# Patient Record
Sex: Female | Born: 1990 | Hispanic: No | Marital: Single | Smoking: Current every day smoker
Health system: Southern US, Community
[De-identification: ages and names within clinical notes are randomized; demographics above are authoritative.]

## PROBLEM LIST (undated history)

## (undated) ENCOUNTER — Inpatient Hospital Stay (HOSPITAL_COMMUNITY): Payer: Self-pay

## (undated) ENCOUNTER — Inpatient Hospital Stay: Payer: Self-pay

## (undated) DIAGNOSIS — D649 Anemia, unspecified: Secondary | ICD-10-CM

## (undated) DIAGNOSIS — O139 Gestational [pregnancy-induced] hypertension without significant proteinuria, unspecified trimester: Secondary | ICD-10-CM

## (undated) HISTORY — PX: WISDOM TOOTH EXTRACTION: SHX21

## (undated) HISTORY — DX: Gestational (pregnancy-induced) hypertension without significant proteinuria, unspecified trimester: O13.9

---

## 2009-03-05 DIAGNOSIS — O139 Gestational [pregnancy-induced] hypertension without significant proteinuria, unspecified trimester: Secondary | ICD-10-CM

## 2009-03-05 HISTORY — DX: Gestational (pregnancy-induced) hypertension without significant proteinuria, unspecified trimester: O13.9

## 2009-03-22 ENCOUNTER — Inpatient Hospital Stay (HOSPITAL_COMMUNITY): Admission: AD | Admit: 2009-03-22 | Discharge: 2009-03-22 | Payer: Self-pay | Admitting: Obstetrics and Gynecology

## 2009-06-03 ENCOUNTER — Ambulatory Visit: Payer: Self-pay | Admitting: Advanced Practice Midwife

## 2009-07-04 ENCOUNTER — Ambulatory Visit: Payer: Self-pay | Admitting: Obstetrics and Gynecology

## 2009-07-04 ENCOUNTER — Inpatient Hospital Stay (HOSPITAL_COMMUNITY): Admission: AD | Admit: 2009-07-04 | Discharge: 2009-07-04 | Payer: Self-pay | Admitting: Obstetrics & Gynecology

## 2009-10-24 ENCOUNTER — Observation Stay: Payer: Self-pay | Admitting: Unknown Physician Specialty

## 2009-10-29 ENCOUNTER — Observation Stay: Payer: Self-pay | Admitting: Obstetrics and Gynecology

## 2009-10-30 ENCOUNTER — Inpatient Hospital Stay: Payer: Self-pay | Admitting: Obstetrics and Gynecology

## 2010-05-20 ENCOUNTER — Emergency Department (HOSPITAL_COMMUNITY)
Admission: EM | Admit: 2010-05-20 | Discharge: 2010-05-20 | Disposition: A | Discharge status: Home / Self Care | Payer: Self-pay | Attending: Emergency Medicine | Admitting: Emergency Medicine

## 2010-05-20 DIAGNOSIS — L02219 Cutaneous abscess of trunk, unspecified: Secondary | ICD-10-CM | POA: Insufficient documentation

## 2010-05-22 LAB — WET PREP, GENITAL
Trich, Wet Prep: NONE SEEN
Yeast Wet Prep HPF POC: NONE SEEN

## 2010-05-22 LAB — CBC
HCT: 37.4 % (ref 36.0–46.0)
Hemoglobin: 12.4 g/dL (ref 12.0–15.0)
MCHC: 33 g/dL (ref 30.0–36.0)
MCV: 87.5 fL (ref 78.0–100.0)
RBC: 4.27 MIL/uL (ref 3.87–5.11)
RDW: 12.6 % (ref 11.5–15.5)

## 2010-05-22 LAB — URINALYSIS, ROUTINE W REFLEX MICROSCOPIC
Bilirubin Urine: NEGATIVE
Glucose, UA: NEGATIVE mg/dL
Hgb urine dipstick: NEGATIVE

## 2010-05-22 LAB — GC/CHLAMYDIA PROBE AMP, GENITAL: Chlamydia, DNA Probe: POSITIVE — AB

## 2010-05-22 LAB — HCG, QUANTITATIVE, PREGNANCY: hCG, Beta Chain, Quant, S: 62711 m[IU]/mL — ABNORMAL HIGH (ref <5)

## 2010-05-22 LAB — URINE MICROSCOPIC-ADD ON

## 2010-05-23 LAB — URINALYSIS, ROUTINE W REFLEX MICROSCOPIC
Bilirubin Urine: NEGATIVE
Glucose, UA: NEGATIVE mg/dL
Hgb urine dipstick: NEGATIVE
Ketones, ur: NEGATIVE mg/dL
Nitrite: NEGATIVE
Protein, ur: NEGATIVE mg/dL
Specific Gravity, Urine: 1.025 (ref 1.005–1.030)

## 2013-09-14 ENCOUNTER — Emergency Department: Payer: Self-pay | Admitting: Emergency Medicine

## 2013-09-17 LAB — BETA STREP CULTURE(ARMC)

## 2014-01-08 ENCOUNTER — Inpatient Hospital Stay (HOSPITAL_COMMUNITY): Payer: Medicaid Other

## 2014-01-08 ENCOUNTER — Encounter (HOSPITAL_COMMUNITY): Payer: Self-pay | Admitting: *Deleted

## 2014-01-08 ENCOUNTER — Inpatient Hospital Stay (HOSPITAL_COMMUNITY)
Admission: AD | Admit: 2014-01-08 | Discharge: 2014-01-08 | Disposition: A | Discharge status: Home / Self Care | Payer: Medicaid Other | Source: Ambulatory Visit | Attending: Obstetrics & Gynecology | Admitting: Obstetrics & Gynecology

## 2014-01-08 DIAGNOSIS — O26899 Other specified pregnancy related conditions, unspecified trimester: Secondary | ICD-10-CM

## 2014-01-08 DIAGNOSIS — O3411 Maternal care for benign tumor of corpus uteri, first trimester: Secondary | ICD-10-CM | POA: Insufficient documentation

## 2014-01-08 DIAGNOSIS — R109 Unspecified abdominal pain: Secondary | ICD-10-CM | POA: Insufficient documentation

## 2014-01-08 DIAGNOSIS — O9989 Other specified diseases and conditions complicating pregnancy, childbirth and the puerperium: Secondary | ICD-10-CM

## 2014-01-08 DIAGNOSIS — O26891 Other specified pregnancy related conditions, first trimester: Secondary | ICD-10-CM | POA: Diagnosis not present

## 2014-01-08 DIAGNOSIS — F17200 Nicotine dependence, unspecified, uncomplicated: Secondary | ICD-10-CM | POA: Diagnosis not present

## 2014-01-08 DIAGNOSIS — Z3A01 Less than 8 weeks gestation of pregnancy: Secondary | ICD-10-CM | POA: Insufficient documentation

## 2014-01-08 LAB — CBC WITH DIFFERENTIAL/PLATELET
BASOS ABS: 0 10*3/uL (ref 0.0–0.1)
Basophils Relative: 0 % (ref 0–1)
Eosinophils Absolute: 0.1 10*3/uL (ref 0.0–0.7)
Eosinophils Relative: 3 % (ref 0–5)
HEMATOCRIT: 37.6 % (ref 36.0–46.0)
HEMOGLOBIN: 12.5 g/dL (ref 12.0–15.0)
LYMPHS ABS: 1.9 10*3/uL (ref 0.7–4.0)
LYMPHS PCT: 34 % (ref 12–46)
MCH: 28.7 pg (ref 26.0–34.0)
MCHC: 33.2 g/dL (ref 30.0–36.0)
MCV: 86.2 fL (ref 78.0–100.0)
MONO ABS: 0.4 10*3/uL (ref 0.1–1.0)
Monocytes Relative: 7 % (ref 3–12)
NEUTROS ABS: 3.2 10*3/uL (ref 1.7–7.7)
Neutrophils Relative %: 56 % (ref 43–77)
Platelets: 204 10*3/uL (ref 150–400)
RBC: 4.36 MIL/uL (ref 3.87–5.11)
RDW: 12.3 % (ref 11.5–15.5)
WBC: 5.7 10*3/uL (ref 4.0–10.5)

## 2014-01-08 LAB — WET PREP, GENITAL
Clue Cells Wet Prep HPF POC: NONE SEEN
Trich, Wet Prep: NONE SEEN
YEAST WET PREP: NONE SEEN

## 2014-01-08 LAB — URINALYSIS, ROUTINE W REFLEX MICROSCOPIC
Bilirubin Urine: NEGATIVE
Glucose, UA: NEGATIVE mg/dL
Hgb urine dipstick: NEGATIVE
KETONES UR: NEGATIVE mg/dL
NITRITE: NEGATIVE
PROTEIN: NEGATIVE mg/dL
Specific Gravity, Urine: 1.025 (ref 1.005–1.030)
UROBILINOGEN UA: 0.2 mg/dL (ref 0.0–1.0)
pH: 6 (ref 5.0–8.0)

## 2014-01-08 LAB — URINE MICROSCOPIC-ADD ON

## 2014-01-08 LAB — HIV ANTIBODY (ROUTINE TESTING W REFLEX): HIV 1&2 Ab, 4th Generation: NONREACTIVE

## 2014-01-08 LAB — HCG, QUANTITATIVE, PREGNANCY: hCG, Beta Chain, Quant, S: 271 m[IU]/mL — ABNORMAL HIGH (ref <5)

## 2014-01-08 LAB — POCT PREGNANCY, URINE: Preg Test, Ur: POSITIVE — AB

## 2014-01-08 NOTE — MAU Provider Note (Signed)
History     CSN: [id number]  Arrival date and time: 01/08/14 4098   First Provider Initiated Contact with Patient 01/08/14 1002      Chief Complaint  Patient presents with  . Abdominal Pain  . Possible Pregnancy   HPI  Ms. SHAWNAE LEIVA is a 23 y.o. G2P1001 at [redacted]w[redacted]d who presents to MAU today with complaint of lower abdominal cramping since yesterday. She states at worst pain is rated at 6/10. She took Ibuprofen yesterday with little relief. She denies pain at this time. She denies vaginal bleeding, discharge, UTI symptoms, fever or N/V/D. She endorses constipation, but states last BM was yesterday. LMP 12/10/13.    OB History    Gravida Para Term Preterm AB TAB SAB Ectopic Multiple Living   2 1 1       1       Past Medical History  Diagnosis Date  . Hypertension     gestational    Past Surgical History  Procedure Laterality Date  . Wisdom tooth extraction      Family History  Problem Relation Age of Onset  . Hypertension Mother   . Hypertension Maternal Grandmother   . Cancer Maternal Grandfather     History  Substance Use Topics  . Smoking status: Current Every Day Smoker -- 0.10 packs/day  . Smokeless tobacco: Never Used  . Alcohol Use: Yes     Comment: every weekend     Allergies: No Known Allergies  No prescriptions prior to admission    Review of Systems  Constitutional: Negative for fever and malaise/fatigue.  Gastrointestinal: Positive for constipation. Negative for nausea, vomiting, abdominal pain and diarrhea.  Genitourinary: Negative for dysuria, urgency and frequency.       Neg - vaginal bleeding, discharge   Physical Exam   Blood pressure 117/68, pulse 79, temperature 98.6 F (37 C), temperature source Oral, resp. rate 16, height 5' 5.5" (1.664 m), weight 192 lb (87.091 kg), last menstrual period 12/10/2013, SpO2 99 %.  Physical Exam  Constitutional: She is oriented to person, place, and time. She appears well-developed and  well-nourished. No distress.  HENT:  Head: Normocephalic.  Cardiovascular: Normal rate.   Respiratory: Effort normal.  GI: Soft. Bowel sounds are normal. She exhibits no distension and no mass. There is no tenderness. There is no rebound and no guarding.  Genitourinary: Uterus is not enlarged and not tender. Cervix exhibits no motion tenderness, no discharge and no friability. Right adnexum displays no mass and no tenderness. Left adnexum displays no mass and no tenderness. No bleeding in the vagina. Vaginal discharge (small amount of thin, white discharge noted) found.  Neurological: She is alert and oriented to person, place, and time.  Skin: Skin is warm and dry. No erythema.  Psychiatric: She has a normal mood and affect.    Results for orders placed or performed during the hospital encounter of 01/08/14 (from the past 24 hour(s))  Urinalysis, Routine w reflex microscopic     Status: Abnormal   Collection Time: 01/08/14  9:30 AM  Result Value Ref Range   Color, Urine YELLOW YELLOW   APPearance CLOUDY (A) CLEAR   Specific Gravity, Urine 1.025 1.005 - 1.030   pH 6.0 5.0 - 8.0   Glucose, UA NEGATIVE NEGATIVE mg/dL   Hgb urine dipstick NEGATIVE NEGATIVE   Bilirubin Urine NEGATIVE NEGATIVE   Ketones, ur NEGATIVE NEGATIVE mg/dL   Protein, ur NEGATIVE NEGATIVE mg/dL   Urobilinogen, UA 0.2 0.0 - 1.0 mg/dL  Nitrite NEGATIVE NEGATIVE   Leukocytes, UA SMALL (A) NEGATIVE  Urine microscopic-add on     Status: Abnormal   Collection Time: 01/08/14  9:30 AM  Result Value Ref Range   Squamous Epithelial / LPF MANY (A) RARE   WBC, UA 0-2 <3 WBC/hpf   Bacteria, UA MANY (A) RARE   Urine-Other MUCOUS PRESENT   Pregnancy, urine POC     Status: Abnormal   Collection Time: 01/08/14  9:45 AM  Result Value Ref Range   Preg Test, Ur POSITIVE (A) NEGATIVE  hCG, quantitative, pregnancy     Status: Abnormal   Collection Time: 01/08/14 10:09 AM  Result Value Ref Range   hCG, Beta Chain, Quant, S 271  (H) <5 mIU/mL  Wet prep, genital     Status: Abnormal   Collection Time: 01/08/14 10:10 AM  Result Value Ref Range   Yeast Wet Prep HPF POC NONE SEEN NONE SEEN   Trich, Wet Prep NONE SEEN NONE SEEN   Clue Cells Wet Prep HPF POC NONE SEEN NONE SEEN   WBC, Wet Prep HPF POC MODERATE (A) NONE SEEN  CBC with Differential     Status: None   Collection Time: 01/08/14 10:10 AM  Result Value Ref Range   WBC 5.7 4.0 - 10.5 K/uL   RBC 4.36 3.87 - 5.11 MIL/uL   Hemoglobin 12.5 12.0 - 15.0 g/dL   HCT 40.937.6 81.136.0 - 91.446.0 %   MCV 86.2 78.0 - 100.0 fL   MCH 28.7 26.0 - 34.0 pg   MCHC 33.2 30.0 - 36.0 g/dL   RDW 78.212.3 95.611.5 - 21.315.5 %   Platelets 204 150 - 400 K/uL   Neutrophils Relative % 56 43 - 77 %   Neutro Abs 3.2 1.7 - 7.7 K/uL   Lymphocytes Relative 34 12 - 46 %   Lymphs Abs 1.9 0.7 - 4.0 K/uL   Monocytes Relative 7 3 - 12 %   Monocytes Absolute 0.4 0.1 - 1.0 K/uL   Eosinophils Relative 3 0 - 5 %   Eosinophils Absolute 0.1 0.0 - 0.7 K/uL   Basophils Relative 0 0 - 1 %   Basophils Absolute 0.0 0.0 - 0.1 K/uL   [place] Ob Comp Less 14 Wks  01/08/2014   CLINICAL DATA:  Four-week history of pelvic cramping. Last menstrual period 12/10/2013.  EXAM: OBSTETRIC <14 WK US AND TRANSVAGINAL OB US  TECHNIQUE: Both transabdominal and transvaginal ultrasound examinations were performed for complete evaluation of the gestation as well as the maternal uterus, adnexal regions, and pelvic cul-de-sac. Transvaginal technique was performed to assess early pregnancy.  COMPARISON:  03/22/2009.  FINDINGS: Intrauterine gestational sac: None.  Yolk sac:  None.  Embryo:  None.  Cardiac Activity: None.  Heart Rate:  N/A  Maternal uterus/adnexae: 2.3 cm isoechoic lesion in the right ovary, likely represent a degenerating corpus luteum cyst. Left ovary is normal in appearance with multiple small follicles. Uterus is retroverted and remarkable for the presence of a 1.1 x 1.2 x 1.3 cm hypoechoic lesion in the right side of the posterior  wall of the uterine body, compatible with a small fibroid. Small nabothian cyst incidentally noted. Small volume of free fluid in the cul-de-sac.  IMPRESSION: 1. No IUP identified at this time. Repeat pelvic ultrasound recommended in 7-10 days. 2. Small volume of free fluid in the cul-de-sac, presumably physiologic in this young female patient. 3. Retroverted uterus with small fibroid, as above. 4. Probable degenerating corpus luteum cyst in the right  ovary.   Electronically Signed   By: Trudie Reedaniel  Entrikin M.D.   On: 01/08/2014 11:57   [place] Ob Transvaginal  01/08/2014   CLINICAL DATA:  Four-week history of pelvic cramping. Last menstrual period 12/10/2013.  EXAM: OBSTETRIC <14 WK US AND TRANSVAGINAL OB US  TECHNIQUE: Both transabdominal and transvaginal ultrasound examinations were performed for complete evaluation of the gestation as well as the maternal uterus, adnexal regions, and pelvic cul-de-sac. Transvaginal technique was performed to assess early pregnancy.  COMPARISON:  03/22/2009.  FINDINGS: Intrauterine gestational sac: None.  Yolk sac:  None.  Embryo:  None.  Cardiac Activity: None.  Heart Rate:  N/A  Maternal uterus/adnexae: 2.3 cm isoechoic lesion in the right ovary, likely represent a degenerating corpus luteum cyst. Left ovary is normal in appearance with multiple small follicles. Uterus is retroverted and remarkable for the presence of a 1.1 x 1.2 x 1.3 cm hypoechoic lesion in the right side of the posterior wall of the uterine body, compatible with a small fibroid. Small nabothian cyst incidentally noted. Small volume of free fluid in the cul-de-sac.  IMPRESSION: 1. No IUP identified at this time. Repeat pelvic ultrasound recommended in 7-10 days. 2. Small volume of free fluid in the cul-de-sac, presumably physiologic in this young female patient. 3. Retroverted uterus with small fibroid, as above. 4. Probable degenerating corpus luteum cyst in the right ovary.   Electronically Signed   By: Trudie Reedaniel   Entrikin M.D.   On: 01/08/2014 11:57    MAU Course  Procedures None  MDM + UPT UA, wet prep, GC/Chlamydia, CBC, quant hCG and US today Urine culture pending Assessment and Plan  A: Abdominal pain in pregnancy Pregnancy of unknown location Small uterine fibroid  P: Discharge home Bleeding/Ectopic precautions discussed Urine culture and GC/Chlamydia pending Patient to return to MAU in 48 hours for follow-up labs or sooner if symptoms were to change or worsen   Marny LowensteinJulie N Kory Rains, PA-C  01/08/2014, 12:12 PM

## 2014-01-08 NOTE — MAU Note (Signed)
I'm late and I don't feel good.  Might be preg, has not done a home test.  Has a cold and has some cramping.

## 2014-01-08 NOTE — Discharge Instructions (Signed)
Human Chorionic Gonadotropin (hCG) °This is a test to confirm and monitor pregnancy or to diagnose trophoblastic disease or germ cell tumors. °As early as 10 days after a missed menstrual period (some methods can detect hCG even earlier, at one week after conception) or if your caregiver thinks that your symptoms suggest ectopic pregnancy, a failing pregnancy, trophoblastic disease, or germ cell tumors. hCG is a protein produced in the placenta of a pregnant woman. A pregnancy test is a specific blood or urine test that can detect hCG and confirm pregnancy. This hormone is able to be detected 10 days after a missed menstrual period, the time period when the fertilized egg is implanted in the woman's uterus. With some methods, hCG can be detected even earlier, at one week after conception.  °During the early weeks of pregnancy, hCG is important in maintaining function of the corpus luteum (the mass of cells that forms from a mature egg). Production of hCG increases steadily during the first trimester (8-10 weeks), peaking around the 10th week after the last menstrual cycle. Levels then fall slowly during the remainder of the pregnancy. hCG is no longer detectable within a few weeks after delivery. hCG is also produced by some germ cell tumors and increased levels are seen in trophoblastic disease. °SAMPLE COLLECTION °hCG is commonly detected in urine. The preferred specimen is a random urine sample collected first thing in the morning. hCG can also be measured in blood drawn from a vein in the arm. °NORMAL FINDINGS °Qualitative: negative in non-pregnant women; positive in pregnancy °Quantitative:  °· Gestation less than 1 week: 5-50 Whole HCG (milli-international units/mL) °· Gestation of 2 weeks: 50-500 Whole HCG (milli-international units/mL) °· Gestation of 3 weeks: 100-10,000 Whole HCG (milli-international units/mL) °· Gestation of 4 weeks: 1,000-30,000 Whole HCG (milli-international units/mL) °· Gestation of 5  weeks 3,500-115,000 Whole HCG (milli-international units/mL) °· Gestation of 6-8 weeks: 12,000-270,000 Whole HCG (milli-international units/mL) °· Gestation of 12 weeks: 15,000-220,000 Whole HCG (milli-international units/mL) °· Males and non-pregnant females: less than 5 Whole HCG (milli-international units/mL) °Beta subunit: depends on the method and test used °Ranges for normal findings may vary among different laboratories and hospitals. You should always check with your doctor after having lab work or other tests done to discuss the meaning of your test results and whether your values are considered within normal limits. °MEANING OF TEST  °Your caregiver will go over the test results with you and discuss the importance and meaning of your results, as well as treatment options and the need for additional tests if necessary. °OBTAINING THE TEST RESULTS °It is your responsibility to obtain your test results. Ask the lab or department performing the test when and how you will get your results. °Document Released: 03/23/2004 Document Revised: 05/14/2011 Document Reviewed: 05/25/2013 °Exit[employer]® Patient Information ©2015 [employer]. This information is not intended to replace advice given to you by your health care provider. Make sure you discuss any questions you have with your health care provider. ° °

## 2014-01-09 LAB — GC/CHLAMYDIA PROBE AMP
CT Probe RNA: NEGATIVE
GC Probe RNA: NEGATIVE

## 2014-01-10 ENCOUNTER — Inpatient Hospital Stay (HOSPITAL_COMMUNITY)
Admission: AD | Admit: 2014-01-10 | Discharge: 2014-01-10 | Disposition: A | Discharge status: Home / Self Care | Payer: Medicaid Other | Source: Ambulatory Visit | Attending: Family Medicine | Admitting: Family Medicine

## 2014-01-10 DIAGNOSIS — R109 Unspecified abdominal pain: Secondary | ICD-10-CM | POA: Diagnosis not present

## 2014-01-10 DIAGNOSIS — F17210 Nicotine dependence, cigarettes, uncomplicated: Secondary | ICD-10-CM | POA: Diagnosis not present

## 2014-01-10 DIAGNOSIS — O26899 Other specified pregnancy related conditions, unspecified trimester: Secondary | ICD-10-CM

## 2014-01-10 DIAGNOSIS — Z3A01 Less than 8 weeks gestation of pregnancy: Secondary | ICD-10-CM | POA: Diagnosis not present

## 2014-01-10 DIAGNOSIS — O9989 Other specified diseases and conditions complicating pregnancy, childbirth and the puerperium: Secondary | ICD-10-CM

## 2014-01-10 DIAGNOSIS — O26891 Other specified pregnancy related conditions, first trimester: Secondary | ICD-10-CM | POA: Diagnosis present

## 2014-01-10 LAB — HCG, QUANTITATIVE, PREGNANCY: hCG, Beta Chain, Quant, S: 747 m[IU]/mL — ABNORMAL HIGH (ref <5)

## 2014-01-10 MED ORDER — PRENATAL VITAMINS PLUS 27-1 MG PO TABS: 1.0000 | ORAL_TABLET | Freq: Every day | ORAL | Status: DC

## 2014-01-10 NOTE — MAU Provider Note (Signed)
  History     CSN: [id number]  Arrival date and time: 01/10/14 [id number]  Seen by provider at 1030     Chief Complaint  Patient presents with  . Labs Only   HPI Donna Livingston 23 y.o.  Comes for repeat quant today.  Seen in MAU on 01-08-14.  Notes and labs reviewed from that visit.  OB History    Gravida Para Term Preterm AB TAB SAB Ectopic Multiple Living   2 1 1       1       Past Medical History  Diagnosis Date  . Hypertension     gestational    Past Surgical History  Procedure Laterality Date  . Wisdom tooth extraction      Family History  Problem Relation Age of Onset  . Hypertension Mother   . Hypertension Maternal Grandmother   . Cancer Maternal Grandfather     History  Substance Use Topics  . Smoking status: Current Every Day Smoker -- 0.10 packs/day  . Smokeless tobacco: Never Used  . Alcohol Use: Yes     Comment: every weekend     Allergies: No Known Allergies  No prescriptions prior to admission    Review of Systems  Constitutional: Negative for fever.  Gastrointestinal: Negative for nausea, vomiting and abdominal pain.  Genitourinary:       No vaginal discharge. No vaginal bleeding. No dysuria.   Physical Exam   Blood pressure 127/60, pulse 72, temperature 98.9 F (37.2 C), temperature source Oral, resp. rate 18, last menstrual period 12/10/2013.  Physical Exam  Nursing note and vitals reviewed. Constitutional: She is oriented to person, place, and time. She appears well-developed and well-nourished.  HENT:  Head: Normocephalic.  Eyes: EOM are normal.  Neck: Neck supple.  Musculoskeletal: Normal range of motion.  Neurological: She is alert and oriented to person, place, and time.  Skin: Skin is warm and dry.  Psychiatric: She has a normal mood and affect.    MAU Course  Procedures Results for Donna Livingston, Donna Livingston (MRN [id number]) as of 01/10/2014 10:20  Ref. Range 01/08/2014 09:45 01/08/2014 10:09 01/08/2014 10:10 01/08/2014 11:25  01/10/2014 09:39  hCG, Beta Chain, Quant, Livingston Latest Range: <5 mIU/mL  271 (H)   747 (H)   MDM   Assessment and Plan  Appropriately rising quant in early pregnancy Abdominal pain in pregnancy.  Plan Will eprescribe prenatal vitamins for client No smoking, no drugs, no alcohol.  Take a prenatal vitamin one by mouth every day.  Eat small frequent snacks to avoid nausea.  Begin prenatal care as soon as possible. Plans to call and schedule an appointment in the clinic downstairs. Expect ultrasound to call and schedule an appointment for a repeat ultrasound.  Donna Livingston 01/10/2014, 10:20 AM

## 2014-01-10 NOTE — MAU Note (Signed)
Pt presents to mau for repeat BHCG

## 2014-01-11 LAB — CULTURE, OB URINE: Colony Count: 9000

## 2014-01-18 ENCOUNTER — Ambulatory Visit (HOSPITAL_COMMUNITY)
Admission: RE | Admit: 2014-01-18 | Discharge: 2014-01-18 | Disposition: A | Discharge status: Home / Self Care | Payer: Medicaid Other | Source: Ambulatory Visit | Attending: Nurse Practitioner | Admitting: Nurse Practitioner

## 2014-01-18 ENCOUNTER — Telehealth: Payer: Self-pay | Admitting: Obstetrics and Gynecology

## 2014-01-18 DIAGNOSIS — O208 Other hemorrhage in early pregnancy: Secondary | ICD-10-CM | POA: Diagnosis not present

## 2014-01-18 DIAGNOSIS — R109 Unspecified abdominal pain: Secondary | ICD-10-CM | POA: Insufficient documentation

## 2014-01-18 DIAGNOSIS — O9989 Other specified diseases and conditions complicating pregnancy, childbirth and the puerperium: Secondary | ICD-10-CM | POA: Insufficient documentation

## 2014-01-18 NOTE — Telephone Encounter (Signed)
Patient and significant other brought to NP office to discuss US results. Questions answered. Patient to start prenatal care ASAP.

## 2014-02-05 LAB — OB RESULTS CONSOLE GC/CHLAMYDIA
Chlamydia: NEGATIVE
Gonorrhea: NEGATIVE

## 2014-03-05 NOTE — L&D Delivery Note (Signed)
Delivery Note At 5:24 PM a viable female was delivered via Vaginal, Spontaneous Delivery (Presentation: Left Occiput Anterior).  APGAR: 8, 9; weight  .   Placenta status: Intact, Spontaneous.  Cord: 3 vessels with the following complications: none .  Cord pH: N/A  Anesthesia: Epidural  Episiotomy: None Lacerations: None Suture Repair: N/A Est. Blood Loss (mL): 400  Approx 50 minute second stage. Infant to maternal abdomen for delayed cord clamping, cut by FOB after pulsation stopped. Infant to skin-to-skin with mother.   Baby's Name: Cherylynn RidgesZiya  Mom to postpartum.  Baby to Couplet care / Skin to Skin.  Marta AntuBrothers, Katharina Jehle 09/10/2014, 5:42 PM

## 2014-06-24 LAB — OB RESULTS CONSOLE RPR: RPR: NONREACTIVE

## 2014-06-24 LAB — OB RESULTS CONSOLE HIV ANTIBODY (ROUTINE TESTING): HIV: NONREACTIVE

## 2014-08-16 LAB — OB RESULTS CONSOLE GBS: GBS: NEGATIVE

## 2014-09-09 ENCOUNTER — Inpatient Hospital Stay
Admission: RE | Admit: 2014-09-09 | Discharge: 2014-09-09 | Disposition: A | Discharge status: Home / Self Care | Payer: Medicaid Other | Source: Home / Self Care | Attending: Certified Nurse Midwife | Admitting: Certified Nurse Midwife

## 2014-09-09 DIAGNOSIS — Z3A39 39 weeks gestation of pregnancy: Secondary | ICD-10-CM

## 2014-09-09 DIAGNOSIS — O471 False labor at or after 37 completed weeks of gestation: Secondary | ICD-10-CM | POA: Insufficient documentation

## 2014-09-09 DIAGNOSIS — O9081 Anemia of the puerperium: Secondary | ICD-10-CM | POA: Diagnosis not present

## 2014-09-09 DIAGNOSIS — D649 Anemia, unspecified: Secondary | ICD-10-CM | POA: Diagnosis not present

## 2014-09-09 DIAGNOSIS — O133 Gestational [pregnancy-induced] hypertension without significant proteinuria, third trimester: Principal | ICD-10-CM | POA: Diagnosis present

## 2014-09-09 MED ORDER — MORPHINE SULFATE 10 MG/ML IJ SOLN: 10.0000 mg | INTRAMUSCULAR | Status: DC | PRN

## 2014-09-09 MED ORDER — MORPHINE SULFATE 2 MG/ML IJ SOLN: 2.0000 mg | INTRAMUSCULAR | Status: DC | PRN

## 2014-09-09 NOTE — Progress Notes (Signed)
  Labor Progress Note   24 y.o. G2P1001 @ 6414w0d , admitted for  Pregnancy, early contractions, Abdominal Pain.  Subjective:  Still having painful ctxs.  Pt describes as 9/10.    Objective:  BP 125/70 mmHg  Pulse 103  Temp(Src) 97.8 F (36.6 C) (Oral)  LMP 12/10/2013 (Exact Date) Abd: gravid ND, T is mild to mod on observation and palpation Extr: trace to 1+ bilateral pedal edema SVE: CERVIX: 3 cm dilated, 70 effaced, -2 station, cervical position mid  EFM: FHR: 140 bpm, variability: moderate,  accelerations:  Present,  decelerations:  Absent Toco: Frequency: Every 5 minutes Labs: I have reviewed the patient's lab results.   Assessment & Plan:  G2P1001 @ 8114w0d, admitted for early Pregnancy and Labor/Delivery Management--- No cervical change over several hours of monitoring, pt describes painful ctxs although observation is not as severe as described.  1. Pain management: none. 2. FWB: FHT category 1.  3. ID: GBS negative 4. Labor management: Cont to monitor for cervical change.  Consider analgesia.    All discussed with patient, see orders

## 2014-09-09 NOTE — Discharge Summary (Signed)
Patient discharged home, labor precautions and discharge instructions reviewed and given. Pt states understanding, denies any other needs at this time. Pt left floor in stable condition.

## 2014-09-09 NOTE — Discharge Summary (Signed)
Gynecology Discharge Summary Date of Admission: 09/09/2014 Date of Discharge: 09/09/2014  The patient was admitted, as scheduled, and underwent a NST and monitoring for labor; please refer to progress notes for full details.  She was not in labor, just [employer] CorporationBraxton Hicks vs Early Labor.  Chose to go home until active labor develops.    Medication List    ASK your doctor about these medications        PRENATAL VITAMINS PLUS 27-1 MG Tabs  Take 1 tablet by mouth daily. Generic is OK - Prenatal vitamin with 1 mg Folic acid       D/C Dx: Abd pain, false labor.  A NST procedure was performed with FHR monitoring and a normal baseline established, appropriate time of 20-40 minutes of evaluation, and accels >2 seen w 15x15 characteristics.  Results show a REACTIVE NST.    [facility]

## 2014-09-09 NOTE — H&P (Signed)
Obstetrics Admission History & Physical   CC: Contractions   HPI:  24 y.o. G2P1001 @ 4062w0d (09/16/2014, by Last Menstrual Period). Admitted on 09/09/2014:   Painful ctxs this am, mucus plug, no bleeding or ROM.  [facility]  PMHx:  Past Medical History  Diagnosis Date  . Hypertension     gestational   PSHx:  Past Surgical History  Procedure Laterality Date  . Wisdom tooth extraction     Medications:  Prescriptions prior to admission  Medication Sig Dispense Refill Last Dose  . Prenatal Vit-Fe Fumarate-FA (PRENATAL VITAMINS PLUS) 27-1 MG TABS Take 1 tablet by mouth daily. Generic is OK - Prenatal vitamin with 1 mg Folic acid 30 tablet 0 09/08/2014 at Unknown time   Allergies: has No Known Allergies. OBHx:  OB History  Gravida Para Term Preterm AB SAB TAB Ectopic Multiple Living  2 1 1       1     # Outcome Date GA Lbr Len/2nd Weight Sex Delivery Anes PTL Lv  2 Current           1 Term              ZOX:[id number]:Negative/unremarkable except as detailed in HPI. Soc Hx: Pregnancy welcomed  Objective:   Filed Vitals:   09/09/14 1308  BP: 113/78  Pulse: 97  Temp: 97.8 F (36.6 C)   General: Well nourished, well developed female in no acute distress.  Skin: Warm and dry.  Cardiovascular:Regular rate and rhythm. Respiratory: Clear to auscultation bilateral. Normal respiratory effort Abdomen: mild, without guarding, gravid Neuro/Psych: Normal mood and affect.   Pelvic exam: is not limited by body habitus EGBUS: within normal limits Vagina: within normal limits and with normal mucosa blood in the vault Cervix: 3/70/-2 Uterus: Spontaneous uterine activity  Adnexa: normal adnexa  EFM:FHR: 140 bpm, variability: moderate,  accelerations:  Present,  decelerations:  Absent Toco: Frequency: Every 5 minutes   Perinatal info:  Blood type: B positive Rubella- Immune Varicella -Immune TDaP last tetanus booster within 10 years, Given during third trimester of this  pregnancy RPR NR / HIV Neg/ HBsAg Neg   Assessment & Plan:   24 y.o. G2P1001 @ 7762w0d, Admitted on 09/09/2014: evaluation for labor as she has ctxs and has cervical change compared to yesterdays office examination.  Will monitor for progression of cervical dilation.  Ambulation, fluids.     Observe for cervical change and Fetal Wellbeing Reassuring

## 2014-09-10 ENCOUNTER — Inpatient Hospital Stay
Admission: EM | Admit: 2014-09-10 | Discharge: 2014-09-12 | DRG: 775 | Disposition: A | Discharge status: Home / Self Care | Payer: Medicaid Other | Attending: Obstetrics & Gynecology | Admitting: Obstetrics & Gynecology

## 2014-09-10 ENCOUNTER — Inpatient Hospital Stay: Payer: Medicaid Other | Admitting: Anesthesiology

## 2014-09-10 DIAGNOSIS — R109 Unspecified abdominal pain: Secondary | ICD-10-CM | POA: Diagnosis present

## 2014-09-10 DIAGNOSIS — O9081 Anemia of the puerperium: Secondary | ICD-10-CM | POA: Diagnosis not present

## 2014-09-10 DIAGNOSIS — D649 Anemia, unspecified: Secondary | ICD-10-CM | POA: Diagnosis not present

## 2014-09-10 DIAGNOSIS — Z3A39 39 weeks gestation of pregnancy: Secondary | ICD-10-CM | POA: Diagnosis present

## 2014-09-10 DIAGNOSIS — O133 Gestational [pregnancy-induced] hypertension without significant proteinuria, third trimester: Secondary | ICD-10-CM | POA: Diagnosis present

## 2014-09-10 HISTORY — DX: Anemia, unspecified: D64.9

## 2014-09-10 LAB — CBC
HCT: 34.7 % — ABNORMAL LOW (ref 35.0–47.0)
Hemoglobin: 11.2 g/dL — ABNORMAL LOW (ref 12.0–16.0)
MCH: 27.2 pg (ref 26.0–34.0)
MCHC: 32.4 g/dL (ref 32.0–36.0)
MCV: 84.1 fL (ref 80.0–100.0)
PLATELETS: 159 10*3/uL (ref 150–440)
RBC: 4.13 MIL/uL (ref 3.80–5.20)
RDW: 15.3 % — AB (ref 11.5–14.5)
WBC: 7.9 10*3/uL (ref 3.6–11.0)

## 2014-09-10 LAB — ABO/RH: ABO/RH(D): B POS

## 2014-09-10 LAB — TYPE AND SCREEN
ABO/RH(D): B POS
Antibody Screen: NEGATIVE

## 2014-09-10 MED ORDER — MISOPROSTOL 200 MCG PO TABS
ORAL_TABLET | ORAL | Status: AC
  Filled 2014-09-10: qty 4

## 2014-09-10 MED ORDER — SIMETHICONE 80 MG PO CHEW: 80.0000 mg | CHEWABLE_TABLET | ORAL | Status: DC | PRN

## 2014-09-10 MED ORDER — WITCH HAZEL-GLYCERIN EX PADS: 1.0000 "application " | MEDICATED_PAD | CUTANEOUS | Status: DC | PRN

## 2014-09-10 MED ORDER — DIPHENHYDRAMINE HCL 50 MG/ML IJ SOLN: 12.5000 mg | INTRAMUSCULAR | Status: DC | PRN

## 2014-09-10 MED ORDER — FENTANYL 2.5 MCG/ML W/ROPIVACAINE 0.2% IN NS 100 ML EPIDURAL INFUSION (ARMC-ANES): 9.0000 mL/h | EPIDURAL | Status: DC

## 2014-09-10 MED ORDER — PHENYLEPHRINE 40 MCG/ML (10ML) SYRINGE FOR IV PUSH (FOR BLOOD PRESSURE SUPPORT)
80.0000 ug | PREFILLED_SYRINGE | INTRAVENOUS | Status: DC | PRN
  Filled 2014-09-10: qty 2

## 2014-09-10 MED ORDER — DIBUCAINE 1 % RE OINT
1.0000 "application " | TOPICAL_OINTMENT | RECTAL | Status: DC | PRN
  Filled 2014-09-10: qty 28

## 2014-09-10 MED ORDER — TERBUTALINE SULFATE 1 MG/ML IJ SOLN: 0.2500 mg | Freq: Once | INTRAMUSCULAR | Status: DC | PRN

## 2014-09-10 MED ORDER — ACETAMINOPHEN 325 MG PO TABS: 650.0000 mg | ORAL_TABLET | ORAL | Status: DC | PRN

## 2014-09-10 MED ORDER — OXYCODONE-ACETAMINOPHEN 5-325 MG PO TABS: 1.0000 | ORAL_TABLET | ORAL | Status: DC | PRN

## 2014-09-10 MED ORDER — OXYCODONE-ACETAMINOPHEN 5-325 MG PO TABS: 2.0000 | ORAL_TABLET | ORAL | Status: DC | PRN

## 2014-09-10 MED ORDER — LACTATED RINGERS IV SOLN: 500.0000 mL | INTRAVENOUS | Status: DC | PRN

## 2014-09-10 MED ORDER — SODIUM CHLORIDE 0.9 % IJ SOLN
INTRAMUSCULAR | Status: AC
  Filled 2014-09-10: qty 10

## 2014-09-10 MED ORDER — CITRIC ACID-SODIUM CITRATE 334-500 MG/5ML PO SOLN: 30.0000 mL | ORAL | Status: DC | PRN

## 2014-09-10 MED ORDER — EPHEDRINE SULFATE 50 MG/ML IJ SOLN
INTRAMUSCULAR | Status: AC
  Administered 2014-09-10: 10 mg
  Filled 2014-09-10: qty 1

## 2014-09-10 MED ORDER — BUPIVACAINE HCL (PF) 0.25 % IJ SOLN
INTRAMUSCULAR | Status: DC | PRN
  Administered 2014-09-10 (×2): 4 mL

## 2014-09-10 MED ORDER — DIPHENHYDRAMINE HCL 25 MG PO CAPS: 25.0000 mg | ORAL_CAPSULE | Freq: Four times a day (QID) | ORAL | Status: DC | PRN

## 2014-09-10 MED ORDER — LIDOCAINE-EPINEPHRINE (PF) 1.5 %-1:200000 IJ SOLN
INTRAMUSCULAR | Status: DC | PRN
  Administered 2014-09-10: 3 mL via EPIDURAL

## 2014-09-10 MED ORDER — AMMONIA AROMATIC IN INHA
RESPIRATORY_TRACT | Status: AC
  Filled 2014-09-10: qty 10

## 2014-09-10 MED ORDER — LIDOCAINE HCL (PF) 1 % IJ SOLN
30.0000 mL | INTRAMUSCULAR | Status: DC | PRN
  Filled 2014-09-10: qty 30

## 2014-09-10 MED ORDER — OXYTOCIN BOLUS FROM INFUSION: 500.0000 mL | INTRAVENOUS | Status: DC

## 2014-09-10 MED ORDER — FENTANYL 2.5 MCG/ML W/ROPIVACAINE 0.2% IN NS 100 ML EPIDURAL INFUSION (ARMC-ANES)
EPIDURAL | Status: AC
  Administered 2014-09-10: 9 mL/h via EPIDURAL
  Filled 2014-09-10: qty 100

## 2014-09-10 MED ORDER — LANOLIN HYDROUS EX OINT: TOPICAL_OINTMENT | CUTANEOUS | Status: DC | PRN

## 2014-09-10 MED ORDER — IBUPROFEN 600 MG PO TABS
600.0000 mg | ORAL_TABLET | Freq: Four times a day (QID) | ORAL | Status: DC
  Administered 2014-09-10 – 2014-09-12 (×6): 600 mg via ORAL
  Filled 2014-09-10 (×6): qty 1

## 2014-09-10 MED ORDER — PRENATAL MULTIVITAMIN CH
1.0000 | ORAL_TABLET | Freq: Every day | ORAL | Status: DC
  Administered 2014-09-11 – 2014-09-12 (×2): 1 via ORAL
  Filled 2014-09-10 (×2): qty 1

## 2014-09-10 MED ORDER — BUTORPHANOL TARTRATE 1 MG/ML IJ SOLN
2.0000 mg | INTRAMUSCULAR | Status: DC | PRN
  Administered 2014-09-10: 2 mg via INTRAVENOUS
  Filled 2014-09-10: qty 2

## 2014-09-10 MED ORDER — ONDANSETRON HCL 4 MG PO TABS: 4.0000 mg | ORAL_TABLET | ORAL | Status: DC | PRN

## 2014-09-10 MED ORDER — LACTATED RINGERS IV SOLN: INTRAVENOUS | Status: DC

## 2014-09-10 MED ORDER — LACTATED RINGERS IV SOLN
INTRAVENOUS | Status: DC
  Administered 2014-09-10: 09:00:00 via INTRAVENOUS

## 2014-09-10 MED ORDER — ONDANSETRON HCL 4 MG/2ML IJ SOLN: 4.0000 mg | Freq: Four times a day (QID) | INTRAMUSCULAR | Status: DC | PRN

## 2014-09-10 MED ORDER — BUTORPHANOL TARTRATE 1 MG/ML IJ SOLN: 1.0000 mg | INTRAMUSCULAR | Status: DC | PRN

## 2014-09-10 MED ORDER — ONDANSETRON HCL 4 MG/2ML IJ SOLN
4.0000 mg | INTRAMUSCULAR | Status: DC | PRN
Start: 2014-09-10 — End: 2014-09-12

## 2014-09-10 MED ORDER — EPHEDRINE 5 MG/ML INJ
10.0000 mg | INTRAVENOUS | Status: DC | PRN
Start: 2014-09-10 — End: 2014-09-10
  Administered 2014-09-10: 10 mg via INTRAVENOUS
  Filled 2014-09-10: qty 2

## 2014-09-10 MED ORDER — BENZOCAINE-MENTHOL 20-0.5 % EX AERO
1.0000 "application " | INHALATION_SPRAY | CUTANEOUS | Status: DC | PRN
  Administered 2014-09-11: 1 via TOPICAL
  Filled 2014-09-10: qty 56

## 2014-09-10 MED ORDER — OXYTOCIN 40 UNITS IN LACTATED RINGERS INFUSION - SIMPLE MED
1.0000 m[IU]/min | INTRAVENOUS | Status: DC
  Administered 2014-09-10: 2 m[IU]/min via INTRAVENOUS

## 2014-09-10 MED ORDER — OXYTOCIN 40 UNITS IN LACTATED RINGERS INFUSION - SIMPLE MED
INTRAVENOUS | Status: AC
  Filled 2014-09-10: qty 1000

## 2014-09-10 MED ORDER — LIDOCAINE HCL (PF) 1 % IJ SOLN
INTRAMUSCULAR | Status: AC
  Filled 2014-09-10: qty 30

## 2014-09-10 MED ORDER — FERROUS SULFATE 325 (65 FE) MG PO TABS
325.0000 mg | ORAL_TABLET | Freq: Every day | ORAL | Status: DC
  Administered 2014-09-11 – 2014-09-12 (×2): 325 mg via ORAL
  Filled 2014-09-10 (×2): qty 1

## 2014-09-10 MED ORDER — OXYTOCIN 40 UNITS IN LACTATED RINGERS INFUSION - SIMPLE MED: 62.5000 mL/h | INTRAVENOUS | Status: DC

## 2014-09-10 MED ORDER — DOCUSATE SODIUM 100 MG PO CAPS
100.0000 mg | ORAL_CAPSULE | Freq: Two times a day (BID) | ORAL | Status: DC
  Administered 2014-09-11 – 2014-09-12 (×2): 100 mg via ORAL
  Filled 2014-09-10 (×2): qty 1

## 2014-09-10 MED ORDER — ZOLPIDEM TARTRATE 5 MG PO TABS: 5.0000 mg | ORAL_TABLET | Freq: Every evening | ORAL | Status: DC | PRN

## 2014-09-10 MED ORDER — OXYCODONE-ACETAMINOPHEN 5-325 MG PO TABS
2.0000 | ORAL_TABLET | ORAL | Status: DC | PRN
Start: 2014-09-10 — End: 2014-09-10

## 2014-09-10 NOTE — OB Triage Note (Signed)
Pt presents to L&D with c/o contractions q 2-5 min since 0100. Denies vaginal bleeding or leaking of fluid

## 2014-09-10 NOTE — Anesthesia Preprocedure Evaluation (Signed)
Anesthesia Evaluation  Patient identified by MRN, date of birth, ID band Patient awake    Reviewed: Allergy & Precautions, H&P , Patient's Chart, lab work & pertinent test results  History of Anesthesia Complications Negative for: history of anesthetic complications  Airway Mallampati: II   Neck ROM: full    Dental no notable dental hx.    Pulmonary former smoker,    Pulmonary exam normal       Cardiovascular Normal cardiovascular exam    Neuro/Psych    GI/Hepatic   Endo/Other    Renal/GU      Musculoskeletal   Abdominal   Peds  Hematology   Anesthesia Other Findings   Reproductive/Obstetrics (+) Pregnancy                             Anesthesia Physical Anesthesia Plan  ASA: II  Anesthesia Plan: Epidural   Post-op Pain Management:    Induction:   Airway Management Planned:   Additional Equipment:   Intra-op Plan:   Post-operative Plan:   Informed Consent: I have reviewed the patients History and Physical, chart, labs and discussed the procedure including the risks, benefits and alternatives for the proposed anesthesia with the patient or authorized representative who has indicated his/her understanding and acceptance.     Plan Discussed with: Anesthesiologist  Anesthesia Plan Comments:         Anesthesia Quick Evaluation

## 2014-09-10 NOTE — Progress Notes (Signed)
S: denies feeling pressure O: Cervix unchanged from 1 hour ago, ctx per toco appear somewhat irregular, category 1 tracing A: IUP at term, labor, irregular contractions since epidural P: Pitocin augmentation, pt agrees to POM

## 2014-09-10 NOTE — Anesthesia Procedure Notes (Addendum)
Epidural Patient location during procedure: OB Start time: 09/10/2014 8:32 AM End time: 09/10/2014 9:39 AM  Staffing Anesthesiologist: Forestine ChutePOLIN, Delano Scardino Performed by: anesthesiologist   Preanesthetic Checklist Completed: patient identified, surgical consent, pre-op evaluation, timeout performed, IV checked, risks and benefits discussed and monitors and equipment checked  Epidural Patient position: sitting Prep: Betadine Patient monitoring: heart rate, continuous pulse ox and blood pressure Approach: midline Location: L3-L4 Injection technique: LOR saline  Needle:  Needle type: Tuohy  Needle gauge: 18 G Needle length: 9 cm Needle insertion depth: 7 cm Catheter type: closed end flexible Catheter size: 20 Guage Catheter at skin depth: 11 cm Test dose: negative and 1.5% lidocaine with Epi 1:200 K  Assessment Events: blood not aspirated, injection not painful, no injection resistance, negative IV test and no paresthesia  Additional Notes Reason for block:procedure for pain

## 2014-09-10 NOTE — H&P (Signed)
Obstetrics Admission History & Physical   CC: Contractions   HPI:  24 y.o. G2P1001 @ 8618w1d (09/16/2014, by Last Menstrual Period). Admitted on 09/10/2014:   Pt seen yesterday, staid at 3 cm, went home after several hours of monitoring.  Painful ctxs this am, mucus plug yesterday, no bleeding or ROM.  [facility]  PMHx:  Past Medical History  Diagnosis Date  . Anemia    PSHx:  Past Surgical History  Procedure Laterality Date  . Wisdom tooth extraction     Medications:  Prescriptions prior to admission  Medication Sig Dispense Refill Last Dose  . Prenatal Vit-Fe Fumarate-FA (PRENATAL VITAMINS PLUS) 27-1 MG TABS Take 1 tablet by mouth daily. Generic is OK - Prenatal vitamin with 1 mg Folic acid 30 tablet 0 09/08/2014 at Unknown time   Allergies: has No Known Allergies. OBHx:  OB History  Gravida Para Term Preterm AB SAB TAB Ectopic Multiple Living  2 1 1       1     # Outcome Date GA Lbr Len/2nd Weight Sex Delivery Anes PTL Lv  2 Current           1 Term              ZOX:[id number]:Negative/unremarkable except as detailed in HPI. Soc Hx: Pregnancy welcomed  Objective:   Filed Vitals:   09/10/14 0512  BP: 128/75  Pulse: 97  Temp: 98.3 F (36.8 C)  Resp: 18   General: Well nourished, well developed female in no acute distress.  Skin: Warm and dry.  Cardiovascular:Regular rate and rhythm. Respiratory: Clear to auscultation bilateral. Normal respiratory effort Abdomen: mild, without guarding, gravid Neuro/Psych: Normal mood and affect.   Pelvic exam: is not limited by body habitus EGBUS: within normal limits Vagina: within normal limits and with normal mucosa blood in the vault Cervix: 6/80/-1 Uterus: Spontaneous uterine activity  Adnexa: normal adnexa  EFM:FHR: 140 bpm, variability: moderate,  accelerations:  Present,  decelerations:  Absent Toco: Frequency: Every 2-3 minutes   Perinatal info:  Blood type: B positive Rubella- Immune Varicella  -Immune TDaP last tetanus booster within 10 years, Given during third trimester of this pregnancy RPR NR / HIV Neg/ HBsAg Neg   Assessment & Plan:   24 y.o. G2P1001 @ 2918w1d, Admitted on 09/10/2014: evaluation for labor as she has ctxs and has cervical change compared to yesterdays  examination.  Will monitor for progression of cervical dilation.  Ambulation, fluids.  Epidural.     Observe for cervical change and Fetal Wellbeing Reassuring

## 2014-09-10 NOTE — Progress Notes (Signed)
L&D Note  09/10/2014 - 1:00 PM  24 y.o. G2P1001 128w1d   [name] is admitted for labor   Subjective:  Pt reports feeling much more comfortable after epidural  Objective:   Filed Vitals:   09/10/14 1035 09/10/14 1037 09/10/14 1135 09/10/14 1235  BP: 73/29 78/44 94/57  94/68  Pulse: 135 115 96 139  Temp:      TempSrc:      Resp:      Height:      Weight:      SpO2: 96%  100%     Current Vital Signs 24h Vital Sign Ranges  T 98.5 F (36.9 C) Temp  Avg: 98.2 F (36.8 C)  Min: 97.8 F (36.6 C)  Max: 98.5 F (36.9 C)  BP 94/68 mmHg BP  Min: 73/29  Max: 128/75  HR (!) 139 Pulse  Avg: 107.9  Min: 75  Max: 152  RR 18 Resp  Avg: 18  Min: 18  Max: 18  SaO2 100 %   SpO2  Avg: 99.2 %  Min: 96 %  Max: 100 %       24 Hour I/O Current Shift I/O  Time Ins Outs   07/08 0701 - 07/08 1900 In: 500 [I.V.:500] Out: -     FHR: cat 1, 140 baseline, mod variablity, + accels Toco: not graphing well, approx q 2-3 min SVE: 8/90/-1   Assessment :  IUP at 6328w1d, labor    Plan:  AROM with clear fluid obtained. Anticipate vaginal delivery soon  Marta AntuBrothers, Tais Koestner, [place]

## 2014-09-11 LAB — HEMOGLOBIN AND HEMATOCRIT, BLOOD
HCT: 31.9 % — ABNORMAL LOW (ref 35.0–47.0)
Hemoglobin: 10.5 g/dL — ABNORMAL LOW (ref 12.0–16.0)

## 2014-09-11 LAB — RPR: RPR: NONREACTIVE

## 2014-09-11 NOTE — Anesthesia Postprocedure Evaluation (Signed)
  Anesthesia Post-op Note  Patient: Donna [place]  Procedure(s) Performed: * No procedures listed *  Anesthesia type:Epidural  Patient location: Floor  Post pain: Pain level controlled  Post assessment: Post-op Vital signs reviewed, Patient's Cardiovascular Status Stable, Respiratory Function Stable, Patent Airway and No signs of Nausea or vomiting  Post vital signs: Reviewed and stable  Last Vitals:  Filed Vitals:   09/11/14 0744  BP: 112/53  Pulse: 80  Temp: 36.8 C  Resp:     Level of consciousness: awake, alert  and patient cooperative  Complications: No apparent anesthesia complications

## 2014-09-11 NOTE — Progress Notes (Addendum)
Post Partum Day 1 Subjective: Voided. Tolerating a regualr diet. Baby bottle feeding. Not much rest overnight.  Objective: Blood pressure 112/53, pulse 80, temperature 98.2 F (36.8 C), temperature source Oral, resp. rate 18, height 5\' 7"  (1.702 m), weight 96.163 kg (212 lb), last menstrual period 12/10/2013, SpO2 99 %, unknown if currently breastfeeding.  Physical Exam:  General: alert , appears tired. Lochia: appropriate Uterine Fundus: firm, U-1, ML, NT DVT Evaluation: No evidence of DVT seen on physical exam.   Recent Labs  09/10/14 0559 09/11/14 0534  HGB 11.2* 10.5*  HCT 34.7* 31.9*  WBC 7.9  --   PLT 159  --     Assessment/Plan: Plan for discharge tomorrow  B POS/ RI/ VI Bottle Zaria   LOS: 1 day   Ashlen Kiger 09/11/2014, 10:18 AM

## 2014-09-12 MED ORDER — HYDROCORTISONE ACE-PRAMOXINE 2.5-1 % RE CREA: 1.0000 "application " | TOPICAL_CREAM | Freq: Three times a day (TID) | RECTAL | Status: DC | PRN

## 2014-09-12 MED ORDER — DOCUSATE SODIUM 100 MG PO CAPS: 100.0000 mg | ORAL_CAPSULE | Freq: Two times a day (BID) | ORAL | Status: DC | PRN

## 2014-09-12 MED ORDER — IBUPROFEN 600 MG PO TABS: 600.0000 mg | ORAL_TABLET | Freq: Four times a day (QID) | ORAL | Status: DC

## 2014-09-12 MED ORDER — BENZOCAINE-MENTHOL 20-0.5 % EX AERO: 1.0000 "application " | INHALATION_SPRAY | CUTANEOUS | Status: DC | PRN

## 2014-09-12 NOTE — Discharge Summary (Signed)
Obstetrical Discharge Summary  Date of Admission: 09/10/2014 Date of Discharge: 09/12/2014  Primary OB: [facility] Gestational Age at Delivery: 6952w1d   Antepartum complications: anemia Reason for Admission: labor Date of Delivery: 09/10/2014  Delivered By: T. Brothers, CNM Delivery Type: spontaneous vaginal delivery Intrapartum complications/course: Normal vaginal delivery without complications Anesthesia: epidural Placenta: spontaneous, to pathology: No. Laceration: none Episiotomy: none Baby: Liveborn female, APGARs8/9, weight 3858 g. (8#7oz)/ Donna [place]  Exam: FF/NT/ML. ?umbilical hernia vs large diastasis. Lochia WNL. Did pass a egg sized clot when getting OOB this AM. Voiding without difficulty. Having some hemorrhoidal pain. Small ext hemorrhoid seen. Pumping-but not getting much out. Bottle feeding q2 hr. Postpartum course: benign postpartum course  Disposition: Home  Rh Immune globulin given: not applicable Rubella vaccine given: not applicable Tdap vaccine given in AP or PP setting: yes Flu vaccine given in AP or PP setting: not applicable  Contraception: Mirena  Prenatal/Postnatal Panel: B POS//Rubella Immune//RPR negative//HIV negative/HepB Surface Ag negative, Varicella Immune.{Plan bottle and pumping   Plan:  Donna [place] was discharged to home in good condition. Follow-up appointment with CNM Brothers in 6 weeks  Discharge Medications:   Medication List    TAKE these medications        benzocaine-Menthol 20-0.5 % Aero  Commonly known as:  DERMOPLAST  Apply 1 application topically as needed for irritation (perineal discomfort).     docusate sodium 100 MG capsule  Commonly known as:  COLACE  Take 1 capsule (100 mg total) by mouth 2 (two) times daily as needed for mild constipation.     hydrocortisone-pramoxine 2.5-1 % rectal cream  Commonly known as:  ANALPRAM HC  Place 1 application rectally 3 (three) times daily as needed for hemorrhoids or itching.      ibuprofen 600 MG tablet  Commonly known as:  ADVIL,MOTRIN  Take 1 tablet (600 mg total) by mouth every 6 (six) hours.     PRENATAL VITAMINS PLUS 27-1 MG Tabs  Take 1 tablet by mouth daily. Generic is OK - Prenatal vitamin with 1 mg Folic acid        Donna [place], Donna [place], CNM [facility]  Pager: [phone]

## 2014-09-12 NOTE — Progress Notes (Signed)
Patient discharge to home via wheelchair with spouse and baby in car seat.  

## 2014-09-12 NOTE — Discharge Instructions (Signed)
° ° °  We will discuss your surgery once again in detail at your post-op visit in two to four weeks. If you haven’t already done so, please call to make your appointment as soon as possible. ° °Donna Livingston  °[address]  °[place], [place] [address] [place], [place] [address]  °Phone: [phone] Phone: [phone]  °Fax: [phone] Fax: [phone]  ° ° ° ° ° ° °

## 2014-10-28 LAB — HM PAP SMEAR: HM Pap smear: NEGATIVE

## 2016-08-03 ENCOUNTER — Ambulatory Visit (INDEPENDENT_AMBULATORY_CARE_PROVIDER_SITE_OTHER): Payer: Medicaid Other | Admitting: Obstetrics and Gynecology

## 2016-08-03 ENCOUNTER — Encounter: Payer: Self-pay | Admitting: Obstetrics and Gynecology

## 2016-08-03 VITALS — BP 126/62 | HR 59 | Wt 193.0 lb

## 2016-08-03 DIAGNOSIS — Z30431 Encounter for routine checking of intrauterine contraceptive device: Secondary | ICD-10-CM | POA: Diagnosis not present

## 2016-08-03 DIAGNOSIS — Z113 Encounter for screening for infections with a predominantly sexual mode of transmission: Secondary | ICD-10-CM | POA: Diagnosis not present

## 2016-08-04 LAB — HEP, RPR, HIV PANEL
HEP B S AG: NEGATIVE
HIV Screen 4th Generation wRfx: NONREACTIVE
RPR: NONREACTIVE

## 2016-08-05 LAB — GC/CHLAMYDIA PROBE AMP
Chlamydia trachomatis, NAA: NEGATIVE
Neisseria gonorrhoeae by PCR: NEGATIVE

## 2016-08-05 NOTE — Progress Notes (Signed)
Obstetrics & Gynecology Office Visit   Chief Complaint:  Chief Complaint  Patient presents with  . IUD check    History of Present Illness: 26 y.o. patient presenting for follow up of Mirena IUD placement 2 year ago ago.  She denies any complications since her IUD placement.  Still having some occasional spotting.  is able to feel strings.  Reports partner complained about feeling strings.  Also requests routine STI screening.  Asymptomatic an no known exposures.    Review of Systems: Review of Systems  Constitutional: Negative for chills, fever and malaise/fatigue.  Gastrointestinal: Negative for abdominal pain.  Musculoskeletal: Negative for joint pain.    Past Medical History:  Past Medical History:  Diagnosis Date  . Anemia     Past Surgical History:  Past Surgical History:  Procedure Laterality Date  . WISDOM TOOTH EXTRACTION      Gynecologic History: No LMP recorded. Patient is not currently having periods (Reason: IUD).  Obstetric History: G70P2001  Family History:  Family History  Problem Relation Age of Onset  . Hypertension Mother   . Hypertension Maternal Grandmother   . Cancer Maternal Grandfather     Social History:  Social History   Social History  . Marital status: Single    Spouse name: N/A  . Number of children: N/A  . Years of education: N/A   Occupational History  . Not on file.   Social History Main Topics  . Smoking status: Former Smoker    Packs/day: 0.10    Types: Cigarettes  . Smokeless tobacco: Never Used  . Alcohol use No     Comment: every weekend   . Drug use: No     Comment: 3 weeks ago  . Sexual activity: Yes    Birth control/ protection: None   Other Topics Concern  . Not on file   Social History Narrative  . No narrative on file    Allergies:  No Known Allergies  Medications: Prior to Admission medications   Medication Sig Start Date End Date Taking? Authorizing Provider  benzocaine-Menthol  (DERMOPLAST) 20-0.5 % AERO Apply 1 application topically as needed for irritation (perineal discomfort). 09/12/14   Farrel Conners, CNM  docusate sodium (COLACE) 100 MG capsule Take 1 capsule (100 mg total) by mouth 2 (two) times daily as needed for mild constipation. 09/12/14   Farrel Conners, CNM  hydrocortisone-pramoxine (ANALPRAM HC) 2.5-1 % rectal cream Place 1 application rectally 3 (three) times daily as needed for hemorrhoids or itching. 09/12/14   Farrel Conners, CNM  ibuprofen (ADVIL,MOTRIN) 600 MG tablet Take 1 tablet (600 mg total) by mouth every 6 (six) hours. 09/12/14   Farrel Conners, CNM  Prenatal Vit-Fe Fumarate-FA (PRENATAL VITAMINS PLUS) 27-1 MG TABS Take 1 tablet by mouth daily. Generic is OK - Prenatal vitamin with 1 mg Folic acid 01/10/14   Currie Paris, NP    Physical Exam Vitals:  Vitals:   08/03/16 1531  BP: 126/62  Pulse: (!) 59   No LMP recorded. Patient is not currently having periods (Reason: IUD).  General: NAD HEENT: normocephalic, anicteric Pulmonary: No increased work of breathing Genitourinary:  External: Normal external female genitalia.  Normal urethral meatus, normal  Bartholin's and Skene's glands.    Vagina: Normal vaginal mucosa, no evidence of prolapse.    Cervix: Grossly normal in appearance, no bleeding, IUD strings visualized 2cm  Uterus: Non-enlarged, mobile, normal contour.  No CMT  Adnexa: ovaries non-enlarged, no adnexal masses  Rectal:  deferred  Lymphatic: no evidence of inguinal lymphadenopathy Extremities: no edema, erythema, or tenderness Neurologic: Grossly intact Psychiatric: mood appropriate, affect full  Female chaperone present for pelvic and breast  portions of the physical exam  Assessment: 26 y.o. G2P2001 No problem-specific Assessment & Plan notes found for this encounter.   Plan: Problem List Items Addressed This Visit    None    Visit Diagnoses    IUD check up    -  Primary   Screen for STD  (sexually transmitted disease)       Relevant Orders   GC/Chlamydia Probe Amp (Completed)   HEP, RPR, HIV Panel (Completed)      - IUD strings in proper location - STI screening discussed and obtained today - Reviewed all forms of birth control options available including abstinence; over the counter/barrier methods; hormonal contraceptive medication including pill, patch, ring, injection,contraceptive implant; hormonal and nonhormonal IUDs; permanent sterilization options including vasectomy and the various tubal sterilization modalities. Risks and benefits reviewed.  Questions were answered.  Information was given to patient to review.  - A total of 15 minutes were spent in face-to-face contact with the patient during this encounter with over half of that time devoted to counseling and coordination of care.

## 2016-10-26 ENCOUNTER — Emergency Department
Admission: EM | Admit: 2016-10-26 | Discharge: 2016-10-26 | Disposition: A | Discharge status: Home / Self Care | Payer: No Typology Code available for payment source | Attending: Emergency Medicine | Admitting: Emergency Medicine

## 2016-10-26 ENCOUNTER — Emergency Department: Payer: No Typology Code available for payment source

## 2016-10-26 ENCOUNTER — Encounter: Payer: Self-pay | Admitting: Emergency Medicine

## 2016-10-26 DIAGNOSIS — Y998 Other external cause status: Secondary | ICD-10-CM | POA: Insufficient documentation

## 2016-10-26 DIAGNOSIS — F17210 Nicotine dependence, cigarettes, uncomplicated: Secondary | ICD-10-CM | POA: Insufficient documentation

## 2016-10-26 DIAGNOSIS — S40022A Contusion of left upper arm, initial encounter: Secondary | ICD-10-CM

## 2016-10-26 DIAGNOSIS — Y929 Unspecified place or not applicable: Secondary | ICD-10-CM | POA: Insufficient documentation

## 2016-10-26 DIAGNOSIS — S161XXA Strain of muscle, fascia and tendon at neck level, initial encounter: Secondary | ICD-10-CM | POA: Insufficient documentation

## 2016-10-26 DIAGNOSIS — Y9389 Activity, other specified: Secondary | ICD-10-CM | POA: Diagnosis not present

## 2016-10-26 DIAGNOSIS — M79602 Pain in left arm: Secondary | ICD-10-CM | POA: Diagnosis not present

## 2016-10-26 DIAGNOSIS — M542 Cervicalgia: Secondary | ICD-10-CM | POA: Diagnosis present

## 2016-10-26 MED ORDER — CYCLOBENZAPRINE HCL 10 MG PO TABS: 10.0000 mg | ORAL_TABLET | Freq: Three times a day (TID) | ORAL | 0 refills | Status: DC | PRN

## 2016-10-26 MED ORDER — NAPROXEN 500 MG PO TABS: 500.0000 mg | ORAL_TABLET | Freq: Two times a day (BID) | ORAL | 0 refills | Status: DC
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
## 2016-10-26 NOTE — ED Notes (Signed)
Reviewed d/c instructions, follow-up care, use of ice/elevation with patient. Pt verbalized understanding 
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
## 2016-10-26 NOTE — Discharge Instructions (Signed)
Follow up with the primary care provider of your choice for symptoms that are not improving over the next few days. REturn to the ER for symptoms that change or worsen if unable to schedule an appointment.

## 2016-10-31 ENCOUNTER — Encounter: Payer: Self-pay | Admitting: Obstetrics and Gynecology

## 2016-10-31 ENCOUNTER — Ambulatory Visit: Payer: Self-pay | Admitting: Obstetrics and Gynecology

## 2016-11-06 ENCOUNTER — Ambulatory Visit (INDEPENDENT_AMBULATORY_CARE_PROVIDER_SITE_OTHER): Payer: Medicaid Other | Admitting: Obstetrics and Gynecology

## 2016-11-06 DIAGNOSIS — Z5329 Procedure and treatment not carried out because of patient's decision for other reasons: Secondary | ICD-10-CM

## 2016-12-07 NOTE — Progress Notes (Signed)
No show

## 2017-01-22 ENCOUNTER — Ambulatory Visit (INDEPENDENT_AMBULATORY_CARE_PROVIDER_SITE_OTHER): Payer: Medicaid Other | Admitting: Obstetrics and Gynecology

## 2017-01-22 ENCOUNTER — Encounter: Payer: Self-pay | Admitting: Obstetrics and Gynecology

## 2017-01-22 VITALS — BP 130/80 | HR 72 | Ht 67.0 in | Wt 196.0 lb

## 2017-01-22 DIAGNOSIS — N898 Other specified noninflammatory disorders of vagina: Secondary | ICD-10-CM | POA: Diagnosis not present

## 2017-01-22 LAB — POCT WET PREP WITH KOH
Clue Cells Wet Prep HPF POC: NEGATIVE
KOH Prep POC: NEGATIVE
Trichomonas, UA: NEGATIVE
WBC WET PREP PER HPF POC: NEGATIVE
YEAST WET PREP PER HPF POC: NEGATIVE

## 2017-01-22 NOTE — Progress Notes (Signed)
Chief Complaint  Patient presents with  . Gynecologic Exam    ?YEAST INF; THINKS MAYBE IT'S FROM [facility]    HPI:      [name] is a 26 y.o. [phone] who LMP was No LMP recorded (lmp unknown). Patient is not currently having periods (Reason: IUD)., presents today for increased vag d/c for the past 2 days without irritation/iching/fishy odor. Pt was spotting a couple days ago and then noticed sx. She is sex active, no new partners since neg STD screen 6/18. No recent abx use, no meds to treat. No LBP, fevers, belly pain. No urin sx. Had yeast vag about 4-5 months ago. Pt concerned about IUD as cause.   Past Medical History:  Diagnosis Date  . Anemia   . Pregnancy induced hypertension 2011    Past Surgical History:  Procedure Laterality Date  . WISDOM TOOTH EXTRACTION      Family History  Problem Relation Age of Onset  . Hypertension Mother   . Hypertension Maternal Grandmother   . Cancer Maternal Grandfather 50       Colon  . Hypertension Maternal Uncle     Social History   Socioeconomic History  . Marital status: Single    Spouse name: Not on file  . Number of children: 2  . Years of education: 5712  . Highest education level: Not on file  Social Needs  . Financial resource strain: Not on file  . Food insecurity - worry: Not on file  . Food insecurity - inability: Not on file  . Transportation needs - medical: Not on file  . Transportation needs - non-medical: Not on file  Occupational History  . Occupation: food services    Employer: [employer]  Tobacco Use  . Smoking status: Current Every Day Smoker    Packs/day: 1.00    Types: Cigarettes  . Smokeless tobacco: Never Used  Substance and Sexual Activity  . Alcohol use: Yes    Comment: every weekend   . Drug use: No    Comment: 3 weeks ago  . Sexual activity: Yes    Birth control/protection: IUD  Other Topics Concern  . Not on file  Social History Narrative  . Not on file     Current  Outpatient Medications:  .  ibuprofen (ADVIL,MOTRIN) 600 MG tablet, Take 1 tablet (600 mg total) by mouth every 6 (six) hours., Disp: 30 tablet, Rfl: 0 .  levonorgestrel (MIRENA, 52 MG,) 20 MCG/24HR IUD, 1 each by Intrauterine route once., Disp: , Rfl:  .  benzocaine-Menthol (DERMOPLAST) 20-0.5 % AERO, Apply 1 application topically as needed for irritation (perineal discomfort). (Patient not taking: Reported on 01/22/2017), Disp: 1 each, Rfl: 0 .  cyclobenzaprine (FLEXERIL) 10 MG tablet, Take 1 tablet (10 mg total) by mouth 3 (three) times daily as needed for muscle spasms. (Patient not taking: Reported on 01/22/2017), Disp: 30 tablet, Rfl: 0 .  docusate sodium (COLACE) 100 MG capsule, Take 1 capsule (100 mg total) by mouth 2 (two) times daily as needed for mild constipation. (Patient not taking: Reported on 01/22/2017), Disp: 10 capsule, Rfl: 0 .  hydrocortisone-pramoxine (ANALPRAM HC) 2.5-1 % rectal cream, Place 1 application rectally 3 (three) times daily as needed for hemorrhoids or itching. (Patient not taking: Reported on 01/22/2017), Disp: 30 g, Rfl: 0 .  naproxen (NAPROSYN) 500 MG tablet, Take 1 tablet (500 mg total) by mouth 2 (two) times daily with a meal. (Patient not taking: Reported on 01/22/2017), Disp: 30  tablet, Rfl: 0 .  Prenatal Vit-Fe Fumarate-FA (PRENATAL VITAMINS PLUS) 27-1 MG TABS, Take 1 tablet by mouth daily. Generic is OK - Prenatal vitamin with 1 mg Folic acid (Patient not taking: Reported on 01/22/2017), Disp: 30 tablet, Rfl: 0   ROS:  Review of Systems  Constitutional: Negative for fever.  Gastrointestinal: Negative for blood in stool, constipation, diarrhea, nausea and vomiting.  Genitourinary: Positive for vaginal discharge. Negative for dyspareunia, dysuria, flank pain, frequency, hematuria, urgency, vaginal bleeding and vaginal pain.  Musculoskeletal: Negative for back pain.  Skin: Negative for rash.     OBJECTIVE:   Vitals:  BP 130/80   Pulse 72   Ht 5\' 7"   (1.702 m)   Wt 196 lb (88.9 kg)   LMP  (LMP Unknown)   BMI 30.70 kg/m   Physical Exam  Constitutional: She is oriented to person, place, and time and well-developed, well-nourished, and in no distress. Vital signs are normal.  Genitourinary: Vagina normal, uterus normal, cervix normal, right adnexa normal, left adnexa normal and vulva normal. Uterus is not enlarged. Cervix exhibits no motion tenderness and no tenderness. Right adnexum displays no mass and no tenderness. Left adnexum displays no mass and no tenderness. Vulva exhibits no erythema, no exudate, no lesion, no rash and no tenderness. Vagina exhibits no lesion.  Genitourinary Comments: IUD STRINGS IN PLACE  Neurological: She is oriented to person, place, and time.  Vitals reviewed.   Results: Results for orders placed or performed in visit on 01/22/17 (from the past 24 hour(s))  POCT Wet Prep with KOH     Status: Normal   Collection Time: 01/22/17  4:08 PM  Result Value Ref Range   Trichomonas, UA Negative    Clue Cells Wet Prep HPF POC neg    Epithelial Wet Prep HPF POC  Few, Moderate, Many, Too numerous to count   Yeast Wet Prep HPF POC neg    Bacteria Wet Prep HPF POC  Few   RBC Wet Prep HPF POC     WBC Wet Prep HPF POC neg    KOH Prep POC Negative Negative     Assessment/Plan: Vaginal discharge - Neg wet prep/exam. Reassurance. F/u prn sx.  - Plan: POCT Wet Prep with KOH    Meds ordered this encounter  Medications  . levonorgestrel (MIRENA, 52 MG,) 20 MCG/24HR IUD    Sig: 1 each by Intrauterine route once.      Return if symptoms worsen or fail to improve.  Alicia B. Copland, PA-C 01/22/2017 4:09 PM

## 2017-01-22 NOTE — Patient Instructions (Signed)
I value your feedback and entrusting us with your care. If you get a [facility] patient survey, I would appreciate you taking the time to let us know about your experience today. Thank you! 

## 2017-01-30 ENCOUNTER — Telehealth: Payer: Self-pay

## 2017-01-30 NOTE — Telephone Encounter (Signed)
Pt has a question about my chart. Cb#[phone]

## 2017-08-09 ENCOUNTER — Ambulatory Visit (INDEPENDENT_AMBULATORY_CARE_PROVIDER_SITE_OTHER): Payer: Medicaid Other | Admitting: Obstetrics and Gynecology

## 2017-08-09 ENCOUNTER — Encounter: Payer: Self-pay | Admitting: Obstetrics and Gynecology

## 2017-08-09 VITALS — BP 118/78 | HR 72 | Ht 67.0 in | Wt 200.0 lb

## 2017-08-09 DIAGNOSIS — Z1231 Encounter for screening mammogram for malignant neoplasm of breast: Secondary | ICD-10-CM

## 2017-08-09 DIAGNOSIS — Z113 Encounter for screening for infections with a predominantly sexual mode of transmission: Secondary | ICD-10-CM | POA: Diagnosis not present

## 2017-08-09 DIAGNOSIS — R6889 Other general symptoms and signs: Secondary | ICD-10-CM | POA: Diagnosis not present

## 2017-08-09 DIAGNOSIS — R5383 Other fatigue: Secondary | ICD-10-CM

## 2017-08-09 DIAGNOSIS — Z0000 Encounter for general adult medical examination without abnormal findings: Secondary | ICD-10-CM

## 2017-08-09 DIAGNOSIS — Z1239 Encounter for other screening for malignant neoplasm of breast: Secondary | ICD-10-CM

## 2017-08-09 DIAGNOSIS — Z124 Encounter for screening for malignant neoplasm of cervix: Secondary | ICD-10-CM | POA: Diagnosis not present

## 2017-08-09 DIAGNOSIS — Z01419 Encounter for gynecological examination (general) (routine) without abnormal findings: Secondary | ICD-10-CM | POA: Diagnosis not present

## 2017-08-09 NOTE — Progress Notes (Signed)
Gynecology Annual Exam   PCP: Patient, No Pcp Per  Chief Complaint:  Chief Complaint  Patient presents with  . Gynecologic Exam    White discharge     History of Present Illness: Patient is a 27 y.o. [id number] presents for annual exam. The patient has no complaints today.   LMP: No LMP recorded. (Menstrual status: IUD). No menses on IUD  The patient is sexually active. She currently uses IUD for contraception. She denies dyspareunia.  The patient does perform self breast exams.  There is no notable family history of breast or ovarian cancer in her family.  The patient wears seatbelts: yes.   The patient has regular exercise: not asked.    The patient denies current symptoms of depression.    Review of Systems: Review of Systems  Constitutional: Negative for chills and fever.  HENT: Negative for congestion.   Respiratory: Negative for cough and shortness of breath.   Cardiovascular: Negative for chest pain and palpitations.  Gastrointestinal: Negative for abdominal pain, constipation, diarrhea, heartburn, nausea and vomiting.  Genitourinary: Negative for dysuria, frequency and urgency.  Skin: Negative for itching and rash.  Neurological: Negative for dizziness and headaches.  Endo/Heme/Allergies: Negative for polydipsia.  Psychiatric/Behavioral: Negative for depression.    Past Medical History:  Past Medical History:  Diagnosis Date  . Anemia   . Pregnancy induced hypertension 2011    Past Surgical History:  Past Surgical History:  Procedure Laterality Date  . WISDOM TOOTH EXTRACTION      Gynecologic History:  No LMP recorded. (Menstrual status: IUD). Contraception: Mirena 10/28/2014 Last Pap: Results were:10/31/2014 no abnormalities   Obstetric History: [id number]  Family History:  Family History  Problem Relation Age of Onset  . Hypertension Mother   . Hypertension Maternal Grandmother   . Cancer Maternal Grandfather 50       Colon  . Hypertension  Maternal Uncle     Social History:  Social History   Socioeconomic History  . Marital status: Single    Spouse name: Not on file  . Number of children: 2  . Years of education: 77  . Highest education level: Not on file  Occupational History  . Occupation: food Chief Financial Officer: [employer]  Social Needs  . Financial resource strain: Not on file  . Food insecurity:    Worry: Not on file    Inability: Not on file  . Transportation needs:    Medical: Not on file    Non-medical: Not on file  Tobacco Use  . Smoking status: Current Every Day Smoker    Packs/day: 1.00    Types: Cigarettes  . Smokeless tobacco: Never Used  Substance and Sexual Activity  . Alcohol use: Yes    Comment: every weekend   . Drug use: No    Comment: 3 weeks ago  . Sexual activity: Yes    Birth control/protection: IUD    Comment: Mirena   Lifestyle  . Physical activity:    Days per week: Not on file    Minutes per session: Not on file  . Stress: Not on file  Relationships  . Social connections:    Talks on phone: Not on file    Gets together: Not on file    Attends religious service: Not on file    Active member of club or organization: Not on file    Attends meetings of clubs or organizations: Not on file    Relationship status: Not  on file  . Intimate partner violence:    Fear of current or ex partner: Not on file    Emotionally abused: Not on file    Physically abused: Not on file    Forced sexual activity: Not on file  Other Topics Concern  . Not on file  Social History Narrative  . Not on file    Allergies:  No Known Allergies  Medications: Prior to Admission medications   Medication Sig Start Date End Date Taking? Authorizing Provider  ibuprofen (ADVIL,MOTRIN) 600 MG tablet Take 1 tablet (600 mg total) by mouth every 6 (six) hours. 09/12/14  Yes Farrel ConnersGutierrez, Colleen, CNM  levonorgestrel (MIRENA, 52 MG,) 20 MCG/24HR IUD 1 each by Intrauterine route once.   Yes [provider]  naproxen (NAPROSYN) 500 MG tablet Take 1 tablet (500 mg total) by mouth 2 (two) times daily with a meal. 10/26/16  Yes Triplett, Cari B, FNP    Physical Exam Vitals: Blood pressure 118/78, pulse 72, height 5\' 7"  (1.702 m), weight 200 lb (90.7 kg).  General: NAD HEENT: normocephalic, anicteric Thyroid: no enlargement, no palpable nodules Pulmonary: No increased work of breathing, CTAB Cardiovascular: RRR, distal pulses 2+ Breast: Breast symmetrical, no tenderness, no palpable nodules or masses, no skin or nipple retraction present, no nipple discharge.  No axillary or supraclavicular lymphadenopathy. Abdomen: NABS, soft, non-tender, non-distended.  Umbilicus with small keloid at site of prior belly button ring.  There is an about 3.5cm nevus on left of the umbilicus.  Patient states stable in size, previously evaluated by dermatology and has been present since birth.  No hepatomegaly, splenomegaly or masses palpable. No evidence of hernia  Genitourinary:  External: Normal external female genitalia.  Normal urethral meatus, normal Bartholin's and Skene's glands.    Vagina: Normal vaginal mucosa, no evidence of prolapse.    Cervix: Grossly normal in appearance, no bleeding, IUD strings visualized 2cm  Uterus: Non-enlarged, mobile, normal contour.  No CMT  Adnexa: ovaries non-enlarged, no adnexal masses  Rectal: deferred  Lymphatic: no evidence of inguinal lymphadenopathy Extremities: no edema, erythema, or tenderness Neurologic: Grossly intact Psychiatric: mood appropriate, affect full  Physical Exam  Abdominal:       Female chaperone present for pelvic and breast  portions of the physical exam    Assessment: 27 y.o. [id number] routine annual exam  Plan: Problem List Items Addressed This Visit    None    Visit Diagnoses    Breast screening    -  Primary   Encounter for gynecological examination without abnormal finding       Relevant Orders   HEP, RPR, HIV  Panel (Completed)   TSH (Completed)   Screening for malignant neoplasm of cervix       Relevant Orders   Pap Lb, Ct-Ng, rfx HPV ASCU   Fatigue, unspecified type       Relevant Orders   TSH (Completed)   Temperature intolerance       Routine screening for STI (sexually transmitted infection)       Relevant Orders   Pap Lb, Ct-Ng, rfx HPV ASCU   HEP, RPR, HIV Panel (Completed)      2) STI screening  wasoffered and accepted  2)  ASCCP guidelines and rational discussed.  Patient opts for every 3 years screening interval  3) Contraception - the patient is currently using  IUD.  She is happy with her current form of contraception and plans to continue  4) Routine healthcare maintenance including  cholesterol, diabetes screening discussed managed by PCP  5) Return in about 1 year (around 08/10/2018) for annual.   Vena Austria, MD, [facility], [facility]  08/09/2017, 10:41 AM

## 2017-08-10 LAB — TSH: TSH: 1.04 u[IU]/mL (ref 0.450–4.500)

## 2017-08-10 LAB — HEP, RPR, HIV PANEL
HIV Screen 4th Generation wRfx: NONREACTIVE
Hepatitis B Surface Ag: NEGATIVE
RPR Ser Ql: NONREACTIVE

## 2017-08-14 ENCOUNTER — Encounter (INDEPENDENT_AMBULATORY_CARE_PROVIDER_SITE_OTHER): Payer: Self-pay

## 2017-08-15 LAB — PAP LB, CT-NG, RFX HPV ASCU
CHLAMYDIA, NUC. ACID AMP: NEGATIVE
Gonococcus, Nuc. Acid Amp: NEGATIVE
PAP Smear Comment: 0

## 2017-08-19 ENCOUNTER — Encounter (INDEPENDENT_AMBULATORY_CARE_PROVIDER_SITE_OTHER): Payer: Self-pay

## 2018-08-18 ENCOUNTER — Ambulatory Visit (INDEPENDENT_AMBULATORY_CARE_PROVIDER_SITE_OTHER): Payer: Medicaid Other | Admitting: Obstetrics and Gynecology

## 2018-08-18 ENCOUNTER — Other Ambulatory Visit: Payer: Self-pay

## 2018-08-18 ENCOUNTER — Encounter: Payer: Self-pay | Admitting: Obstetrics and Gynecology

## 2018-08-18 ENCOUNTER — Other Ambulatory Visit (HOSPITAL_COMMUNITY)
Admission: RE | Admit: 2018-08-18 | Discharge: 2018-08-18 | Disposition: A | Discharge status: Home / Self Care | Payer: Medicaid Other | Source: Ambulatory Visit | Attending: Obstetrics and Gynecology | Admitting: Obstetrics and Gynecology

## 2018-08-18 VITALS — BP 122/80 | HR 68 | Ht 67.0 in | Wt 213.0 lb

## 2018-08-18 DIAGNOSIS — Z01419 Encounter for gynecological examination (general) (routine) without abnormal findings: Secondary | ICD-10-CM

## 2018-08-18 DIAGNOSIS — Z1239 Encounter for other screening for malignant neoplasm of breast: Secondary | ICD-10-CM

## 2018-08-18 DIAGNOSIS — Z30430 Encounter for insertion of intrauterine contraceptive device: Secondary | ICD-10-CM

## 2018-08-18 DIAGNOSIS — Z113 Encounter for screening for infections with a predominantly sexual mode of transmission: Secondary | ICD-10-CM | POA: Diagnosis present

## 2018-08-18 DIAGNOSIS — Z0000 Encounter for general adult medical examination without abnormal findings: Secondary | ICD-10-CM

## 2018-08-18 DIAGNOSIS — Z30433 Encounter for removal and reinsertion of intrauterine contraceptive device: Secondary | ICD-10-CM

## 2018-08-18 DIAGNOSIS — Z30432 Encounter for removal of intrauterine contraceptive device: Secondary | ICD-10-CM

## 2018-08-18 NOTE — Progress Notes (Signed)
Gynecology Annual Exam   PCP: Patient, No Pcp Per  Chief Complaint:  Chief Complaint  Patient presents with   Gynecologic Exam    Would like to get IUD taken out, having hot flashes and her body feels off    History of Present Illness: Patient is a 28 y.o. [id number] presents for annual exam. The patient has no complaints today.  She would like to have her IUD taken out and a copper IUD placed instead. She reports that she is concerned that the Mirena IUD is causing her to have hot flashes. She is also gaining weight which she attributes to the IUD as well. She does not frequently have a period, but se feels like she needs to have a period so that she can feel clean each month.   LMP: No LMP recorded. (Menstrual status: IUD). Average Interval: irregular, 2-3 months  Duration of flow: several days Heavy Menses: no, very light Clots: no Intermenstrual Bleeding: no Postcoital Bleeding: no Dysmenorrhea: no  The patient is sexually active. She currently uses IUD for contraception. She denies dyspareunia.  The patient does perform self breast exams.  There is no notable family history of breast or ovarian cancer in her family.  The patient wears seatbelts: yes.   The patient has regular exercise: no.    The patient denies current symptoms of depression.    Review of Systems: ROS  Past Medical History:  Past Medical History:  Diagnosis Date   Anemia    Pregnancy induced hypertension 2011    Past Surgical History:  Past Surgical History:  Procedure Laterality Date   WISDOM TOOTH EXTRACTION      Gynecologic History:  No LMP recorded. (Menstrual status: IUD). Contraception: IUD Last Pap: Results were: NIL in 2019   Obstetric History: G2P2002  Family History:  Family History  Problem Relation Age of Onset   Hypertension Mother    Hypertension Maternal Grandmother    Cancer Maternal Grandfather [id number]       Colon   Hypertension Maternal Uncle     Social  History:  Social History   Socioeconomic History   Marital status: Single    Spouse name: Not on file   Number of children: 2   Years of education: 12   Highest education level: Not on file  Occupational History   Occupation: food services    Employer: [employer]  Social Needs   Financial resource strain: Not on file   Food insecurity    Worry: Not on file    Inability: Not on file   Transportation needs    Medical: Not on file    Non-medical: Not on file  Tobacco Use   Smoking status: Current Every Day Smoker    Packs/day: 1.00    Types: Cigarettes   Smokeless tobacco: Never Used  Substance and Sexual Activity   Alcohol use: Yes    Comment: every weekend    Drug use: No    Comment: 3 weeks ago   Sexual activity: Yes    Birth control/protection: I.U.D.    Comment: Mirena   Lifestyle   Physical activity    Days per week: Not on file    Minutes per session: Not on file   Stress: Not on file  Relationships   Social connections    Talks on phone: Not on file    Gets together: Not on file    Attends religious service: Not on file    Active member  of club or organization: Not on file    Attends meetings of clubs or organizations: Not on file    Relationship status: Not on file   Intimate partner violence    Fear of current or ex partner: Not on file    Emotionally abused: Not on file    Physically abused: Not on file    Forced sexual activity: Not on file  Other Topics Concern   Not on file  Social History Narrative   Not on file    Allergies:  No Known Allergies  Medications: Prior to Admission medications   Medication Sig Start Date End Date Taking? Authorizing Provider  levonorgestrel (MIRENA, 52 MG,) 20 MCG/24HR IUD 1 each by Intrauterine route once.   Yes [provider]  Multiple Vitamins-Minerals (MULTIVITAMIN ADULT PO) Take by mouth.   Yes [provider]    Physical Exam Vitals: Blood pressure 122/80,  pulse 68, height 5\' 7"  (1.702 m), weight 213 lb (96.6 kg).  General: NAD HEENT: normocephalic, anicteric Thyroid: no enlargement, no palpable nodules Pulmonary: No increased work of breathing, CTAB Cardiovascular: RRR, distal pulses 2+ Breast: Breast symmetrical, no tenderness, no palpable nodules or masses, no skin or nipple retraction present, no nipple discharge.  No axillary or supraclavicular lymphadenopathy. Abdomen: NABS, soft, non-tender, non-distended.  Umbilicus without lesions.  No hepatomegaly, splenomegaly or masses palpable. No evidence of hernia  Genitourinary:  External: Normal external female genitalia.  Normal urethral meatus, normal Bartholin's and Skene's glands.    Vagina: Normal vaginal mucosa, no evidence of prolapse.    Cervix: Grossly normal in appearance, no bleeding, IUD strings seen.   Uterus: Non-enlarged, mobile, normal contour.  No CMT  Adnexa: ovaries non-enlarged, no adnexal masses  Rectal: deferred  Lymphatic: no evidence of inguinal lymphadenopathy Extremities: no edema, erythema, or tenderness Neurologic: Grossly intact Psychiatric: mood appropriate, affect full  Female chaperone present for pelvic and breast  portions of the physical exam  IUD PROCEDURE NOTE:   IUD Removal and Insertion Procedure Note Patient identified, informed consent performed, consent signed.   Discussed risks of irregular bleeding, cramping, infection, malpositioning or misplacement of the IUD outside the uterus which may require further procedure such as laparoscopy, risk of failure <1%. Time out was performed.    A bimanual exam showed the uterus to be anteverted.  Speculum placed in the vagina.  Cervix visualized.  Cleaned with Betadine x 2.  The strings of the Mirena IUD were grasped and pulled removing and intact IUD. The cervix was then grasped anteriorly with a single tooth tenaculum.  Uterus sounded to 8 cm. Paragard IUD placed per manufacturer's recommendations.   Strings trimmed to 3 cm. Tenaculum was removed, good hemostasis noted.  Patient tolerated procedure well.   Patient was given post-procedure instructions.  She was advised to have backup contraception for one week.  Patient was also asked to check IUD strings periodically and follow up in 4 weeks for IUD check.   Assessment: 28 y.o. G2P2002 routine annual exam  Plan: Problem List Items Addressed This Visit    None    Visit Diagnoses    Encounter for gynecological examination without abnormal finding    -  Primary   Relevant Orders   CBC   TSH + free T4   Breast screening       Encounter for insertion of copper intrauterine contraceptive device (IUD)       Encounter for IUD removal       Screening for STDs (  sexually transmitted diseases)       Relevant Orders   HIV antibody (with reflex)   RPR   Cervicovaginal ancillary only   Hepatitis panel, acute   HSV(herpes smplx)abs-1+2(IgG+IgM)-bld      2) STI screening  was offered and accepted. She would like to have blood testing as well. The lab was closed today. She will have this performed at her follow up appointment in 4 weeks.   2)  ASCCP guidelines and rational discussed.  Patient opts for every 3 years screening interval  3) Contraception - the patient is currently using  IUD.  She swithched from a Mirena to a Paragard today.   4) Routine healthcare maintenance including cholesterol, diabetes screening discussed, will return for labs in 4 weeks  5) Return in about 4 weeks (around 09/15/2018) for IUD check.  Adrian Prows MD [facility], [facility] 08/18/2018 11:38 PM

## 2018-08-20 LAB — CERVICOVAGINAL ANCILLARY ONLY
Chlamydia: NEGATIVE
Neisseria Gonorrhea: NEGATIVE

## 2018-09-22 ENCOUNTER — Ambulatory Visit: Payer: Medicaid Other | Admitting: Obstetrics and Gynecology

## 2018-10-03 ENCOUNTER — Ambulatory Visit: Payer: Medicaid Other | Admitting: Obstetrics and Gynecology

## 2018-11-13 ENCOUNTER — Other Ambulatory Visit: Payer: Self-pay | Admitting: Obstetrics and Gynecology

## 2018-11-13 ENCOUNTER — Ambulatory Visit (INDEPENDENT_AMBULATORY_CARE_PROVIDER_SITE_OTHER): Payer: Self-pay | Admitting: Obstetrics and Gynecology

## 2018-11-13 ENCOUNTER — Other Ambulatory Visit: Payer: Self-pay

## 2018-11-13 ENCOUNTER — Encounter: Payer: Self-pay | Admitting: Obstetrics and Gynecology

## 2018-11-13 VITALS — BP 130/80 | HR 66 | Ht 66.5 in | Wt 219.0 lb

## 2018-11-13 DIAGNOSIS — N3000 Acute cystitis without hematuria: Secondary | ICD-10-CM

## 2018-11-13 MED ORDER — NITROFURANTOIN MONOHYD MACRO 100 MG PO CAPS: 100.0000 mg | ORAL_CAPSULE | Freq: Two times a day (BID) | ORAL | 0 refills | Status: AC

## 2018-11-13 NOTE — Progress Notes (Signed)
Patient ID: Donna Livingston, female   DOB: Dec 20, 1990, 28 y.o.   MRN: [id number]  Reason for Consult: Exposure to STD (Would like to check for STD, poss uti, having to urinate alot )   Referred by No ref. provider found  Subjective:     HPI:  Donna Livingston is a 28 y.o. female . She is here because she has been having increase urinary frequency since Monday. She denies back or pelvic pain. Denies fevers  She had a copper IUD inserted recently. She has been happy with it as a form of contraception. She does note that her partner has felt poking of the strings.   She would still like STD lab testing. Does not desires to repeat gonorrhea or chlamydia.  Past Medical History:  Diagnosis Date  . Anemia   . Pregnancy induced hypertension 2011   Family History  Problem Relation Age of Onset  . Hypertension Mother   . Hypertension Maternal Grandmother   . Cancer Maternal Grandfather 50       Colon  . Hypertension Maternal Uncle    Past Surgical History:  Procedure Laterality Date  . WISDOM TOOTH EXTRACTION      Short Social History:  Social History   Tobacco Use  . Smoking status: Current Every Day Smoker    Packs/day: 1.00    Types: Cigarettes  . Smokeless tobacco: Never Used  Substance Use Topics  . Alcohol use: Yes    Comment: every weekend     No Known Allergies  Current Outpatient Medications  Medication Sig Dispense Refill  . Multiple Vitamins-Minerals (MULTIVITAMIN ADULT PO) Take by mouth.    . nitrofurantoin, macrocrystal-monohydrate, (MACROBID) 100 MG capsule Take 1 capsule (100 mg total) by mouth 2 (two) times daily for 5 days. 10 capsule 0   No current facility-administered medications for this visit.     Review of Systems  Constitutional: Negative for chills, fatigue, fever and unexpected weight change.  HENT: Negative for trouble swallowing.  Eyes: Negative for loss of vision.  Respiratory: Negative for cough, shortness of breath and wheezing.   Cardiovascular: Negative for chest pain, leg swelling, palpitations and syncope.  GI: Negative for abdominal pain, blood in stool, diarrhea, nausea and vomiting.  GU: Negative for difficulty urinating, dysuria, frequency and hematuria.  Musculoskeletal: Negative for back pain, leg pain and joint pain.  Skin: Negative for rash.  Neurological: Negative for dizziness, headaches, light-headedness, numbness and seizures.  Psychiatric: Negative for behavioral problem, confusion, depressed mood and sleep disturbance.        Objective:  Objective   Vitals:   11/13/18 1130  BP: 130/80  Pulse: 66  Weight: 219 lb (99.3 kg)  Height: 5' 6.5" (1.689 m)   Body mass index is 34.82 kg/m.  Physical Exam Vitals signs and nursing note reviewed.  Constitutional:      Appearance: She is well-developed.  HENT:     Head: Normocephalic and atraumatic.  Eyes:     Pupils: Pupils are equal, round, and reactive to light.  Cardiovascular:     Rate and Rhythm: Normal rate and regular rhythm.  Pulmonary:     Effort: Pulmonary effort is normal. No respiratory distress.  Abdominal:     General: Abdomen is flat.     Palpations: Abdomen is soft.  Genitourinary:    Comments: IUD strings present.  Clear mucous discharge. IUD strings trimmed with scissors Skin:    General: Skin is warm and dry.  Neurological:  Mental Status: She is alert and oriented to person, place, and time.  Psychiatric:        Behavior: Behavior normal.        Thought Content: Thought content normal.        Judgment: Judgment normal.          Assessment/Plan:     28 yo G2P2002 1. IUD strings trimmed at patient request 2. UTI- will treat with oral antibiotics. Culture sent.  3. HIV testing by labs work today. Ordered before. Declines repeat gonorrhea/chlamydia screening (completed June 2019), no new partners.     Adelene Idlerhristanna  MD [facility], [facility] 11/13/2018 12:16 PM

## 2018-11-15 LAB — CBC
Hematocrit: 38.8 % (ref 34.0–46.6)
Hemoglobin: 12.7 g/dL (ref 11.1–15.9)
MCH: 29 pg (ref 26.6–33.0)
MCHC: 32.7 g/dL (ref 31.5–35.7)
MCV: 89 fL (ref 79–97)
Platelets: 247 10*3/uL (ref 150–450)
RBC: 4.38 x10E6/uL (ref 3.77–5.28)
RDW: 12 % (ref 11.7–15.4)
WBC: 7 10*3/uL (ref 3.4–10.8)

## 2018-11-15 LAB — URINE CULTURE

## 2018-11-15 LAB — TSH+FREE T4
Free T4: 1.64 ng/dL (ref 0.82–1.77)
TSH: 0.791 u[IU]/mL (ref 0.450–4.500)

## 2018-11-15 LAB — RPR: RPR Ser Ql: NONREACTIVE

## 2018-11-15 LAB — HEPATITIS PANEL, ACUTE
Hep A IgM: NEGATIVE
Hep B C IgM: NEGATIVE
Hep C Virus Ab: <0.1 s/co ratio (ref 0.0–0.9)
Hepatitis B Surface Ag: NEGATIVE

## 2018-11-15 LAB — HSV(HERPES SMPLX)ABS-I+II(IGG+IGM)-BLD
HSV 1 Glycoprotein G Ab, IgG: 12.8 index — ABNORMAL HIGH (ref 0.00–0.90)
HSV 2 IgG, Type Spec: 5.35 index — ABNORMAL HIGH (ref 0.00–0.90)
HSVI/II Comb IgM: 1.99 Ratio — ABNORMAL HIGH (ref 0.00–0.90)

## 2018-11-15 LAB — HIV ANTIBODY (ROUTINE TESTING W REFLEX): HIV Screen 4th Generation wRfx: NONREACTIVE

## 2019-01-19 ENCOUNTER — Other Ambulatory Visit: Payer: Self-pay | Admitting: Obstetrics and Gynecology
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
## 2023-06-05 NOTE — Progress Notes (Unsigned)
 PCP:  Pcp, No   Chief Complaint  Patient presents with   Gynecologic Exam    Last 4 cycles are longer than usual, 9 days.   Urinary Tract Infection    Frequency, more than usual. Denies burning.     HPI:      [name] is a 33 y.o. [id number] whose LMP was Patient's last menstrual period was 06/06/2023 (exact date)., presents today for her NP> 3 yrs annual examination.  Her menses are regular every 28-30 days, lasting 4-5 days usually, mod to heavy flow for a day or so, no BTB, mild dysmen, improved with NSAIDs. Past 4 menses lasted 9 days, spotting a few days before bleeding, no change in flow/pain. No stress/wt change/sickness.   Sex activity: single partner, contraception - IUD. Paragard placed 08/18/18; had Mirena removed due to hot flashes and wt gain. No pain/bleeding/dryness, no new partners.  Last Pap: 08/09/17 Results were: no abnormalities   There is no FH of breast cancer. There is no FH of ovarian cancer. The patient does not do self-breast exams.  Tobacco use: The patient denies current or previous tobacco use. Alcohol use: none No drug use.  Exercise: not active  She does get adequate calcium but not Vitamin D in her diet.  She has been having issues with urinary frequency with good flow since past few months with longer menses; no dysuria, hematuria, LBP, pelvic pain, fevers, no vag sx, no recent UTI.   Patient Active Problem List   Diagnosis Date Noted   Postpartum care following vaginal delivery 09/12/2014    Past Surgical History:  Procedure Laterality Date   WISDOM TOOTH EXTRACTION      Family History  Problem Relation Age of Onset   Hypertension Mother    Hypertension Maternal Uncle    Hypertension Maternal Grandmother    Cancer Maternal Grandfather 49       Colon    Social History   Socioeconomic History   Marital status: Single    Spouse name: Not on file   Number of children: 2   Years of education: 12   Highest education level:  Not on file  Occupational History   Occupation: food services    Employer: [employer]  Tobacco Use   Smoking status: Former    Current packs/day: 1.00    Types: Cigarettes   Smokeless tobacco: Never  Vaping Use   Vaping status: Never Used  Substance and Sexual Activity   Alcohol use: Yes    Comment: soc   Drug use: No    Comment: 3 weeks ago   Sexual activity: Yes    Birth control/protection: I.U.D.    Comment: Paragard  Other Topics Concern   Not on file  Social History Narrative   Not on file   Social Drivers of Health   Financial Resource Strain: Not on file  Food Insecurity: Not on file  Transportation Needs: Not on file  Physical Activity: Not on file  Stress: Not on file  Social Connections: Not on file  Intimate Partner Violence: Not on file     Current Outpatient Medications:    PARAGARD INTRAUTERINE COPPER IU, by Intrauterine route., Disp: , Rfl:      ROS:  Review of Systems  Constitutional:  Negative for fatigue, fever and unexpected weight change.  Respiratory:  Negative for cough, shortness of breath and wheezing.   Cardiovascular:  Negative for chest pain, palpitations and leg swelling.  Gastrointestinal:  Negative for  blood in stool, constipation, diarrhea, nausea and vomiting.  Endocrine: Negative for cold intolerance, heat intolerance and polyuria.  Genitourinary:  Positive for frequency. Negative for dyspareunia, dysuria, flank pain, genital sores, hematuria, menstrual problem, pelvic pain, urgency, vaginal bleeding, vaginal discharge and vaginal pain.  Musculoskeletal:  Negative for back pain, joint swelling and myalgias.  Skin:  Negative for rash.  Neurological:  Negative for dizziness, syncope, light-headedness, numbness and headaches.  Hematological:  Negative for adenopathy.  Psychiatric/Behavioral:  Negative for agitation, confusion, sleep disturbance and suicidal ideas. The patient is not nervous/anxious.    BREAST: No  symptoms   Objective: BP 114/72   Ht 5\' 7"  (1.702 m)   Wt 222 lb (100.7 kg)   LMP 06/06/2023 (Exact Date)   BMI 34.77 kg/m    Physical Exam Constitutional:      Appearance: She is well-developed.  Genitourinary:     Vulva normal.     Right Labia: No rash, tenderness or lesions.    Left Labia: No tenderness, lesions or rash.    No vaginal discharge, erythema or tenderness.      Right Adnexa: not tender and no mass present.    Left Adnexa: not tender and no mass present.    No cervical friability or polyp.     IUD strings visualized.     Uterus is not enlarged or tender.  Breasts:    Right: No mass, nipple discharge, skin change or tenderness.     Left: No mass, nipple discharge, skin change or tenderness.  Neck:     Thyroid: No thyromegaly.  Cardiovascular:     Rate and Rhythm: Normal rate and regular rhythm.     Heart sounds: Normal heart sounds. No murmur heard. Pulmonary:     Effort: Pulmonary effort is normal.     Breath sounds: Normal breath sounds.  Abdominal:     Palpations: Abdomen is soft.     Tenderness: There is no abdominal tenderness. There is no guarding or rebound.  Musculoskeletal:        General: Normal range of motion.     Cervical back: Normal range of motion.  Lymphadenopathy:     Cervical: No cervical adenopathy.  Neurological:     General: No focal deficit present.     Mental Status: She is alert and oriented to person, place, and time.     Cranial Nerves: No cranial nerve deficit.  Skin:    General: Skin is warm and dry.  Psychiatric:        Mood and Affect: Mood normal.        Behavior: Behavior normal.        Thought Content: Thought content normal.        Judgment: Judgment normal.  Vitals reviewed.     Results: Results for orders placed or performed in visit on 06/06/23 (from the past 24 hours)  POCT Urinalysis Dipstick     Status: Abnormal   Collection Time: 06/06/23  3:14 PM  Result Value Ref Range   Color, UA yellow     Clarity, UA clear    Glucose, UA Negative Negative   Bilirubin, UA neg    Ketones, UA neg    Spec Grav, UA 1.025 1.010 - 1.025   Blood, UA 3+    pH, UA 5.0 5.0 - 8.0   Protein, UA Negative Negative   Urobilinogen, UA 0.2 0.2 or 1.0 E.U./dL   Nitrite, UA neg    Leukocytes, UA Small (1+) (A) Negative  Appearance     Odor      Assessment/Plan: Encounter for annual routine gynecological examination  Cervical cancer screening - Plan: Cytology - PAP  Screening for HPV (human papillomavirus) - Plan: Cytology - PAP  Encounter for routine checking of intrauterine contraceptive device (IUD) - Plan: US PELVIC COMPLETE WITH TRANSVAGINAL; IUD strings in cx os, has 10 yr indication. Check GYN u/s due to longer menses, will f/u with results.   Irregular menses - Plan: US PELVIC COMPLETE WITH TRANSVAGINAL; check u/s. If WNL, most likely due to IUD. No AUB.   Urinary frequency - Plan: POCT Urinalysis Dipstick, neg UA, check C&S. If neg, checking GYN u/s for uterine abnormality.             GYN counsel adequate intake of calcium and vitamin D, diet and exercise     F/U  Return in about 1 year (around 06/05/2024).  Hildred Pharo B. Nerissa Constantin, PA-C 06/06/2023 3:38 PM

## 2023-06-06 ENCOUNTER — Encounter: Payer: Self-pay | Admitting: Obstetrics and Gynecology

## 2023-06-06 ENCOUNTER — Other Ambulatory Visit (HOSPITAL_COMMUNITY)
Admission: RE | Admit: 2023-06-06 | Discharge: 2023-06-06 | Disposition: A | Discharge status: Home / Self Care | Source: Ambulatory Visit | Attending: Obstetrics and Gynecology | Admitting: Obstetrics and Gynecology

## 2023-06-06 ENCOUNTER — Ambulatory Visit: Admitting: Obstetrics and Gynecology

## 2023-06-06 VITALS — BP 114/72 | Ht 67.0 in | Wt 222.0 lb

## 2023-06-06 DIAGNOSIS — Z124 Encounter for screening for malignant neoplasm of cervix: Secondary | ICD-10-CM

## 2023-06-06 DIAGNOSIS — Z1151 Encounter for screening for human papillomavirus (HPV): Secondary | ICD-10-CM | POA: Diagnosis present

## 2023-06-06 DIAGNOSIS — N926 Irregular menstruation, unspecified: Secondary | ICD-10-CM

## 2023-06-06 DIAGNOSIS — R350 Frequency of micturition: Secondary | ICD-10-CM

## 2023-06-06 DIAGNOSIS — Z30431 Encounter for routine checking of intrauterine contraceptive device: Secondary | ICD-10-CM

## 2023-06-06 DIAGNOSIS — Z01419 Encounter for gynecological examination (general) (routine) without abnormal findings: Secondary | ICD-10-CM | POA: Diagnosis not present

## 2023-06-06 LAB — POCT URINALYSIS DIPSTICK
Bilirubin, UA: NEGATIVE
Glucose, UA: NEGATIVE
Ketones, UA: NEGATIVE
Nitrite, UA: NEGATIVE
Protein, UA: NEGATIVE
Spec Grav, UA: 1.025 (ref 1.010–1.025)
Urobilinogen, UA: 0.2 U/dL
pH, UA: 5 (ref 5.0–8.0)

## 2023-06-06 NOTE — Patient Instructions (Signed)
 I value your feedback and you entrusting [place] with your care. If you get a [place] patient survey, I would appreciate you taking the time to let us know about your experience today. Thank you! ? ? ?

## 2023-06-08 LAB — URINE CULTURE

## 2023-06-09 ENCOUNTER — Encounter: Payer: Self-pay | Admitting: Obstetrics and Gynecology

## 2023-06-10 ENCOUNTER — Encounter: Payer: Self-pay | Admitting: Obstetrics and Gynecology

## 2023-06-10 MED ORDER — METRONIDAZOLE 500 MG PO TABS: ORAL_TABLET | ORAL | 0 refills | Status: DC

## 2023-06-10 NOTE — Telephone Encounter (Signed)
 Yes, please add those.

## 2023-06-10 NOTE — Telephone Encounter (Signed)
 Fax sent with request to add STD testing.

## 2023-06-10 NOTE — Addendum Note (Signed)
 Addended by: Althea Grimmer B on: 06/10/2023 04:30 PM   Modules accepted: Orders

## 2023-06-11 LAB — CYTOLOGY - PAP
Comment: NEGATIVE
Comment: NEGATIVE
Comment: NEGATIVE
Comment: NORMAL
Diagnosis: NEGATIVE
Diagnosis: NEGATIVE
High risk HPV: NEGATIVE
Trichomonas: POSITIVE — AB

## 2023-06-18 ENCOUNTER — Ambulatory Visit
Admission: RE | Admit: 2023-06-18 | Discharge: 2023-06-18 | Disposition: A | Discharge status: Home / Self Care | Source: Ambulatory Visit | Attending: Obstetrics and Gynecology | Admitting: Obstetrics and Gynecology

## 2023-06-18 DIAGNOSIS — N926 Irregular menstruation, unspecified: Secondary | ICD-10-CM | POA: Diagnosis present

## 2023-06-18 DIAGNOSIS — Z30431 Encounter for routine checking of intrauterine contraceptive device: Secondary | ICD-10-CM | POA: Diagnosis present

## 2023-06-20 ENCOUNTER — Encounter: Payer: Self-pay | Admitting: Obstetrics and Gynecology

## 2023-06-20 DIAGNOSIS — N920 Excessive and frequent menstruation with regular cycle: Secondary | ICD-10-CM

## 2023-06-20 DIAGNOSIS — D219 Benign neoplasm of connective and other soft tissue, unspecified: Secondary | ICD-10-CM

## 2023-06-20 NOTE — Telephone Encounter (Signed)
 Spoke with pt. Discussed OCPs, depo, Mirena IUD. Pt currently has paragard due to wt gain/VS sx with mirena. Discussed UFE and pt interested. Ref to [facility]. Pt to f/u prn.

## 2023-07-25 ENCOUNTER — Encounter: Payer: Self-pay | Admitting: Obstetrics and Gynecology

## 2023-07-25 ENCOUNTER — Ambulatory Visit (INDEPENDENT_AMBULATORY_CARE_PROVIDER_SITE_OTHER)

## 2023-07-25 ENCOUNTER — Other Ambulatory Visit (HOSPITAL_COMMUNITY)
Admission: RE | Admit: 2023-07-25 | Discharge: 2023-07-25 | Disposition: A | Discharge status: Home / Self Care | Source: Ambulatory Visit | Attending: Obstetrics and Gynecology | Admitting: Obstetrics and Gynecology

## 2023-07-25 VITALS — BP 131/89 | HR 85 | Resp 16 | Ht 70.0 in | Wt 229.0 lb

## 2023-07-25 DIAGNOSIS — Z113 Encounter for screening for infections with a predominantly sexual mode of transmission: Secondary | ICD-10-CM | POA: Diagnosis present

## 2023-07-25 NOTE — Patient Instructions (Signed)
 Preventing Sexually Transmitted Infections, Adult Sexually transmitted infections (STIs) are spread from person to person (are contagious). They are spread, or transmitted, during sex. The sex may be vaginal, anal, or oral. STIs can be passed during sexual contact with skin, genitals, mouth, or rectum. They may spread through body fluids, such as saliva, semen, blood, vaginal mucus, and urine. STIs are very common. They can happen in people of all ages. Some common STIs are: Herpes. Hepatitis B. Chlamydia. Gonorrhea. Syphilis. Trichomoniasis. Human papillomavirus (HPV). Human immunodeficiency virus (HIV). This can cause acquired immunodeficiency syndrome (AIDS). How can STIs affect me? You may not have symptoms with an STI. Even if you do not have symptoms, you can still spread the infection to others. You also still need treatment. STIs can be treated. Some STIs can be cured. Other STIs cannot be cured and will affect you for the rest of your life. Certain STIs may: Require you to take medicine for the rest of your life. Affect your ability to have children. Increase your risk for getting other STIs. Increase your risk of getting certain conditions. These may include: Cervical cancer. Pelvic inflammatory disease (PID). Organ damage or damage to other parts of your body. This can happen if the infection spreads. Cause problems during pregnancy. STIs may be spread to the baby during pregnancy or birth. Females tend to have more severe problems from STIs than males. What can increase my risk? You may be more at risk for an STI if: You do not use protection during sex. You have more than one sex partner. You have a sex partner who has other sex partners. You have sex with a person who has an STI. You have an STI, or you have had an STI before. You inject drugs or have a sex partner who injects drugs. What actions can I take to prevent STIs? The only way to fully prevent STIs is not to  have sex of any kind. This is called practicing abstinence. If you are sexually active, you can protect yourself and others by taking these actions to lower your risk of getting an STI: Lifestyle Have only one sex partner or limit the number of sex partners you have. Avoid having sex after you have alcohol or drugs. Alcohol and drugs can affect your ability to make good choices. This can lead to risky sexual behaviors. Go to prevention counseling. This can teach you how to avoid getting an STI. Barrier protection  Use methods to stop body fluids from being exchanged between partners during sex (barrier protection). These methods can be used during oral, vaginal, or anal sex. They include: External condom, for males. Internal condom, for females. Dental dam. Use a new barrier method for every sex act from start to finish. Know that a barrier method may not protect you from all STIs. Some STIs, such as herpes, are spread through skin-to-skin contact. Avoid all sexual contact if you or a partner has herpes and there is an active flare with open sores. Birth control pills, injections, implants, and intrauterine devices (IUDs) do not protect against STIs. To prevent both STIs and pregnancy, always use a condom with a second form of birth control. General information Ask your health care provider about taking pre-exposure prophylaxis (PrEP) to prevent HIV. Stay up to date on your vaccines. Some vaccines can lower your risk of getting certain STIs. These include: Hepatitis B vaccine. HPV vaccine. This is recommended for people up to age 66. Get tested for STIs. Have your  partners get tested, too. If you test positive for an STI, follow recommendations from your health care provider about treatment. Make sure your sex partners are tested and treated as well. Where to find more information Learn more about STIs from: Centers for Disease Control and Prevention (CDC): More information about certain  STIs: TonerPromos.no Places to get sexual health counseling and treatment for free or at a low cost: gettested.TonerPromos.no U.S. Department of Health and Human Services [facility]): TravelLesson.ca This information is not intended to replace advice given to you by your health care provider. Make sure you discuss any questions you have with your health care provider. Document Revised: 03/01/2022 Document Reviewed: 08/04/2021 Elsevier Patient Education  2024 [employer].

## 2023-07-25 NOTE — Progress Notes (Signed)
    NURSE VISIT NOTE  Subjective:    Patient ID: RAMINA HULET, female    DOB: 04-Aug-1990, 33 y.o.   MRN: [id number]  HPI  Patient is a 32 y.o. G30P2002 female who presents for [facility] for Trichomoniasis. She tested positive for trich on 06/06/2023. She was treated with a course of metronidazole , which she completed with no missed doses. Denies abnormal vaginal bleeding or significant pelvic pain or fever. denies dysuria, hematuria, urinary frequency, urinary urgency, flank pain, abdominal pain, pelvic pain, and vaginal discharge. Patient admits to history of known exposure to STD.   Objective:    BP 131/89   Pulse 85   Resp 16   Ht 5\' 10"  (1.778 m)   Wt 229 lb (103.9 kg)   BMI 32.86 kg/m    @THIS  VISIT ONLY@  Assessment:   1. Screen for STD (sexually transmitted disease)     bacterial vaginosis, trichomonas, rule out GC or chlamydia, and nonspecific vaginitis  Plan:   GC and chlamydia DNA  probe sent to lab. Treatment: abstain from coitus during course of treatment and see labs for additional treatment. ROV prn if symptoms persist or worsen.     Doneen Fuelling, CMA [facility]

## 2023-07-30 ENCOUNTER — Ambulatory Visit: Payer: Self-pay | Admitting: Obstetrics and Gynecology

## 2023-07-30 LAB — CERVICOVAGINAL ANCILLARY ONLY
Bacterial Vaginitis (gardnerella): POSITIVE — AB
Candida Glabrata: NEGATIVE
Candida Vaginitis: POSITIVE — AB
Chlamydia: NEGATIVE
Comment: NEGATIVE
Comment: NEGATIVE
Comment: NEGATIVE
Comment: NEGATIVE
Comment: NEGATIVE
Comment: NORMAL
Neisseria Gonorrhea: NEGATIVE
Trichomonas: NEGATIVE

## 2023-07-30 MED ORDER — FLUCONAZOLE 150 MG PO TABS
150.0000 mg | ORAL_TABLET | Freq: Once | ORAL | 0 refills | Status: AC
Start: 2023-07-30 — End: 2023-07-30

## 2023-07-30 MED ORDER — METRONIDAZOLE 500 MG PO TABS: 500.0000 mg | ORAL_TABLET | Freq: Two times a day (BID) | ORAL | 0 refills | Status: AC

## 2023-07-30 NOTE — Addendum Note (Signed)
 Addended by: Alyn Judge B on: 07/30/2023 01:36 PM   Modules accepted: Orders

## 2023-12-05 ENCOUNTER — Ambulatory Visit

## 2023-12-05 NOTE — Progress Notes (Unsigned)
    NURSE VISIT NOTE  Subjective:    Patient ID: ANESHA HACKERT, female    DOB: May 22, 1990, 33 y.o.   MRN: [id number]  HPI  Patient is a 33 y.o. G68P2002 female who presents for {pe vag discharge desc:315065} vaginal discharge for *** {gen duration:315003}. Denies abnormal vaginal bleeding or significant pelvic pain or fever. {Actions; denies/reports/admits to:19208} {UTI Symptoms:210800002}. Patient {has/denies:315300} history of known exposure to STD.   Objective:    There were no vitals taken for this visit.   @THIS  VISIT ONLY@  Assessment:   No diagnosis found.  {vaginitis type:315262}  Plan:   GC and chlamydia DNA  probe sent to lab. Treatment: {vaginitis tx:315263} ROV prn if symptoms persist or worsen.   Rollo FORBES Louder, RN

## 2023-12-09 ENCOUNTER — Other Ambulatory Visit (HOSPITAL_COMMUNITY)
Admission: RE | Admit: 2023-12-09 | Discharge: 2023-12-09 | Disposition: A | Discharge status: Home / Self Care | Source: Ambulatory Visit | Attending: Obstetrics | Admitting: Obstetrics

## 2023-12-09 ENCOUNTER — Ambulatory Visit

## 2023-12-09 VITALS — BP 122/70 | HR 84 | Ht 67.0 in | Wt 225.8 lb

## 2023-12-09 DIAGNOSIS — N898 Other specified noninflammatory disorders of vagina: Secondary | ICD-10-CM

## 2023-12-09 NOTE — Progress Notes (Signed)
 Pt r/s

## 2023-12-09 NOTE — Progress Notes (Unsigned)
    NURSE VISIT NOTE  Subjective:    Patient ID: Donna Livingston, female    DOB: Mar 12, 1990, 33 y.o.   MRN: [id number]  HPI  Patient is a 33 y.o. G16P2002 female who presents for white vaginal discharge for 3 day(s). Denies abnormal vaginal bleeding or significant pelvic pain or fever. denies urinary frequency, pelvic pain, genital rash, and genital irritation. Patient has history of known exposure to STD.   Objective:    There were no vitals taken for this visit.   @THIS  VISIT ONLY@  Assessment:   No diagnosis found.  trichomonas  Plan:   GC and chlamydia DNA  probe sent to lab. Treatment: Wait for results ROV prn if symptoms persist or worsen.   Mathis LITTIE Getting, CMA

## 2023-12-10 LAB — CERVICOVAGINAL ANCILLARY ONLY
Bacterial Vaginitis (gardnerella): NEGATIVE
Candida Glabrata: NEGATIVE
Candida Vaginitis: NEGATIVE
Chlamydia: NEGATIVE
Comment: NEGATIVE
Comment: NEGATIVE
Comment: NEGATIVE
Comment: NEGATIVE
Comment: NEGATIVE
Comment: NORMAL
Neisseria Gonorrhea: NEGATIVE
Trichomonas: NEGATIVE

## 2023-12-11 ENCOUNTER — Ambulatory Visit: Payer: Self-pay | Admitting: Obstetrics

## 2023-12-31 ENCOUNTER — Other Ambulatory Visit (HOSPITAL_COMMUNITY)
Admission: RE | Admit: 2023-12-31 | Discharge: 2023-12-31 | Disposition: A | Discharge status: Home / Self Care | Source: Ambulatory Visit | Attending: Licensed Practical Nurse | Admitting: Licensed Practical Nurse

## 2023-12-31 ENCOUNTER — Ambulatory Visit

## 2023-12-31 VITALS — BP 130/86 | HR 76 | Ht 67.0 in | Wt 224.0 lb

## 2023-12-31 DIAGNOSIS — Z113 Encounter for screening for infections with a predominantly sexual mode of transmission: Secondary | ICD-10-CM

## 2023-12-31 DIAGNOSIS — R350 Frequency of micturition: Secondary | ICD-10-CM | POA: Insufficient documentation

## 2023-12-31 LAB — POCT URINALYSIS DIPSTICK
Bilirubin, UA: NEGATIVE
Blood, UA: NEGATIVE
Glucose, UA: NEGATIVE
Ketones, UA: NEGATIVE
Leukocytes, UA: NEGATIVE
Nitrite, UA: NEGATIVE
Protein, UA: NEGATIVE
Spec Grav, UA: 1.01 (ref 1.010–1.025)
Urobilinogen, UA: 0.2 U/dL
pH, UA: 6.5 (ref 5.0–8.0)

## 2023-12-31 NOTE — Addendum Note (Signed)
 Addended by: TAMEA ANNALEE DEL on: 12/31/2023 02:36 PM   Modules accepted: Orders

## 2023-12-31 NOTE — Progress Notes (Signed)
    NURSE VISIT NOTE  Subjective:    Patient ID: Donna Livingston, female    DOB: 05-06-1990, 33 y.o.   MRN: [id number]       HPI  Patient is a 33 y.o. G63P2002 female who presents for urinary frequency for 2 days.  Patient denies any other symptom bout would like to be tested for stds.  Patient does not have a history of recurrent UTI.  Patient does not have a history of pyelonephritis.    Objective:    BP 130/86   Pulse 76   Ht 5' 7 (1.702 m)   Wt 224 lb (101.6 kg)   BMI 35.08 kg/m    Lab Review  No results found for any visits on 12/31/23.  Assessment:   1. Screen for STD (sexually transmitted disease)   2. Urinary frequency      Plan:   Urine Culture Sent. Maintain adequate hydration.  Follow up if symptoms worsen or fail to improve as anticipated, and as needed.    Arlin Sass H Man Bonneau, CMA

## 2024-01-02 ENCOUNTER — Ambulatory Visit: Payer: Self-pay | Admitting: Licensed Practical Nurse

## 2024-01-02 ENCOUNTER — Other Ambulatory Visit: Payer: Self-pay

## 2024-01-02 DIAGNOSIS — B3731 Acute candidiasis of vulva and vagina: Secondary | ICD-10-CM

## 2024-01-02 LAB — URINE CULTURE

## 2024-01-02 MED ORDER — FLUCONAZOLE 150 MG PO TABS: 150.0000 mg | ORAL_TABLET | Freq: Once | ORAL | 0 refills | Status: AC

## 2024-01-02 NOTE — Progress Notes (Signed)
 Rx for diflucan  to treat yeast infection from nurse only swab.

## 2024-01-03 LAB — CERVICOVAGINAL ANCILLARY ONLY
Bacterial Vaginitis (gardnerella): NEGATIVE
Candida Glabrata: NEGATIVE
Candida Vaginitis: POSITIVE — AB
Chlamydia: NEGATIVE
Comment: NEGATIVE
Comment: NEGATIVE
Comment: NEGATIVE
Comment: NEGATIVE
Comment: NEGATIVE
Comment: NORMAL
Neisseria Gonorrhea: NEGATIVE
Trichomonas: NEGATIVE

## 2024-03-02 ENCOUNTER — Ambulatory Visit (INDEPENDENT_AMBULATORY_CARE_PROVIDER_SITE_OTHER): Admitting: Obstetrics

## 2024-03-02 ENCOUNTER — Encounter: Payer: Self-pay | Admitting: Obstetrics

## 2024-03-02 ENCOUNTER — Other Ambulatory Visit (HOSPITAL_COMMUNITY)
Admission: RE | Admit: 2024-03-02 | Discharge: 2024-03-02 | Disposition: A | Discharge status: Home / Self Care | Source: Ambulatory Visit | Attending: Obstetrics | Admitting: Obstetrics

## 2024-03-02 VITALS — BP 121/73 | HR 83 | Wt 228.0 lb

## 2024-03-02 DIAGNOSIS — Z975 Presence of (intrauterine) contraceptive device: Secondary | ICD-10-CM | POA: Diagnosis not present

## 2024-03-02 DIAGNOSIS — N939 Abnormal uterine and vaginal bleeding, unspecified: Secondary | ICD-10-CM | POA: Insufficient documentation

## 2024-03-02 DIAGNOSIS — D259 Leiomyoma of uterus, unspecified: Secondary | ICD-10-CM

## 2024-03-02 MED ORDER — TRANEXAMIC ACID 650 MG PO TABS: 1300.0000 mg | ORAL_TABLET | Freq: Three times a day (TID) | ORAL | 2 refills | Status: AC

## 2024-03-02 NOTE — Progress Notes (Signed)
" ° °  GYN ENCOUNTER  Subjective  HPI: Donna Livingston is a 33 y.o. [id number] who presents today for abnormal bleeding with Paragard  IUD. She has had the Paragard  for about 5 years. Her periods typically last about a week. For the past 3 months, they have been heavier and longer, and sometimes she is menstruating twice a month. She has a known fibroid and is considering a consult for embolization. She has had the Mirena IUD in the past and her periods were much shorter and lighter.  Past Medical History:  Diagnosis Date   Anemia    Pregnancy induced hypertension 2011   Past Surgical History:  Procedure Laterality Date   WISDOM TOOTH EXTRACTION     OB History     Gravida  2   Para  2   Term  2   Preterm      AB      Living  2      SAB      IAB      Ectopic      Multiple  0   Live Births  2          Allergies[1]  ROS: See HPI   Objective  BP 121/73   Pulse 83   Wt 228 lb (103.4 kg)   LMP 02/27/2024   BMI 35.71 kg/m   Physical examination General: Alert, cooperative, NAD Pelvic: Deferred  Assessment Abnormal Uterine Bleeding Fibroid Paragard  IUD in place  Plan -Discussed possible causes of abnormal bleeding, including infection and growth/changes in fibroid.  -Swabs collected -Offered COCs or TXA to decrease bleeding. Rhyse would like to try TXA. Rx sent. Discussed proper administration. -Desires Paragard  removed and replaced with Mirena. Will schedule at her convenience. Declines premedication.  Konstantina Nachreiner, CNM       [1] No Known Allergies  "

## 2024-03-04 ENCOUNTER — Other Ambulatory Visit: Payer: Self-pay | Admitting: Obstetrics

## 2024-03-04 ENCOUNTER — Encounter: Payer: Self-pay | Admitting: Obstetrics

## 2024-03-04 DIAGNOSIS — B9689 Other specified bacterial agents as the cause of diseases classified elsewhere: Secondary | ICD-10-CM | POA: Insufficient documentation

## 2024-03-04 LAB — CERVICOVAGINAL ANCILLARY ONLY
Bacterial Vaginitis (gardnerella): POSITIVE — AB
Candida Glabrata: NEGATIVE
Candida Vaginitis: NEGATIVE
Chlamydia: NEGATIVE
Comment: NEGATIVE
Comment: NEGATIVE
Comment: NEGATIVE
Comment: NEGATIVE
Comment: NEGATIVE
Comment: NORMAL
Neisseria Gonorrhea: NEGATIVE
Trichomonas: NEGATIVE

## 2024-03-04 MED ORDER — METRONIDAZOLE 500 MG PO TABS: 500.0000 mg | ORAL_TABLET | Freq: Two times a day (BID) | ORAL | 0 refills | Status: AC

## 2024-03-04 NOTE — Progress Notes (Signed)
+  BV. Rx for metronidazole  500 mg PO BID x 7 days sent to pharmacy. Crissa notified via MyChart.  M. Justino, CNM

## 2024-04-08 ENCOUNTER — Ambulatory Visit: Payer: Self-pay | Admitting: Obstetrics
